# Patient Record
Sex: Male | Born: 1985 | Race: White | Hispanic: No | Marital: Married | Smoking: Current every day smoker
Health system: Southern US, Community
[De-identification: ages and names within clinical notes are randomized; demographics above are authoritative.]

## PROBLEM LIST (undated history)

## (undated) DIAGNOSIS — G43909 Migraine, unspecified, not intractable, without status migrainosus: Secondary | ICD-10-CM

## (undated) DIAGNOSIS — K219 Gastro-esophageal reflux disease without esophagitis: Secondary | ICD-10-CM

## (undated) DIAGNOSIS — Z720 Tobacco use: Secondary | ICD-10-CM

## (undated) DIAGNOSIS — T7840XA Allergy, unspecified, initial encounter: Secondary | ICD-10-CM

## (undated) HISTORY — DX: Gastro-esophageal reflux disease without esophagitis: K21.9

## (undated) HISTORY — DX: Tobacco use: Z72.0

## (undated) HISTORY — DX: Allergy, unspecified, initial encounter: T78.40XA

---
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
## 2007-06-23 ENCOUNTER — Ambulatory Visit (HOSPITAL_COMMUNITY): Admission: RE | Admit: 2007-06-23 | Discharge: 2007-06-23 | Payer: Self-pay | Admitting: Family Medicine

## 2007-09-20 ENCOUNTER — Emergency Department (HOSPITAL_COMMUNITY): Admission: EM | Admit: 2007-09-20 | Discharge: 2007-09-20 | Payer: Self-pay | Admitting: Emergency Medicine

## 2008-07-01 ENCOUNTER — Emergency Department (HOSPITAL_COMMUNITY): Admission: EM | Admit: 2008-07-01 | Discharge: 2008-07-01 | Payer: Self-pay | Admitting: Emergency Medicine

## 2008-10-04 ENCOUNTER — Emergency Department (HOSPITAL_COMMUNITY): Admission: EM | Admit: 2008-10-04 | Discharge: 2008-10-05 | Payer: Self-pay | Admitting: Emergency Medicine

## 2008-12-15 ENCOUNTER — Emergency Department (HOSPITAL_COMMUNITY): Admission: EM | Admit: 2008-12-15 | Discharge: 2008-12-15 | Payer: Self-pay | Admitting: Emergency Medicine

## 2009-11-25 ENCOUNTER — Emergency Department (HOSPITAL_COMMUNITY): Admission: EM | Admit: 2009-11-25 | Discharge: 2009-11-25 | Payer: Self-pay | Admitting: Emergency Medicine

## 2010-05-09 LAB — COMPREHENSIVE METABOLIC PANEL WITH GFR
ALT: 18 U/L (ref 0–53)
AST: 19 U/L (ref 0–37)
Albumin: 4.4 g/dL (ref 3.5–5.2)
Alkaline Phosphatase: 70 U/L (ref 39–117)
BUN: 8 mg/dL (ref 6–23)
CO2: 27 meq/L (ref 19–32)
Calcium: 9.9 mg/dL (ref 8.4–10.5)
Chloride: 103 meq/L (ref 96–112)
Creatinine, Ser: 0.98 mg/dL (ref 0.4–1.5)
GFR calc non Af Amer: >60 mL/min
Glucose, Bld: 88 mg/dL (ref 70–99)
Potassium: 3.7 meq/L (ref 3.5–5.1)
Sodium: 139 meq/L (ref 135–145)
Total Bilirubin: 0.8 mg/dL (ref 0.3–1.2)
Total Protein: 7.5 g/dL (ref 6.0–8.3)

## 2010-05-09 LAB — DIFFERENTIAL
Basophils Absolute: 0 10*3/uL (ref 0.0–0.1)
Eosinophils Absolute: 0.1 10*3/uL (ref 0.0–0.7)
Eosinophils Relative: 1 % (ref 0–5)
Lymphocytes Relative: 17 % (ref 12–46)
Lymphs Abs: 2.2 10*3/uL (ref 0.7–4.0)
Monocytes Absolute: 0.7 10*3/uL (ref 0.1–1.0)
Monocytes Relative: 6 % (ref 3–12)
Neutro Abs: 9.9 10*3/uL — ABNORMAL HIGH (ref 1.7–7.7)

## 2010-05-09 LAB — CBC
HCT: 48.8 % (ref 39.0–52.0)
Hemoglobin: 16.9 g/dL (ref 13.0–17.0)
MCH: 31.1 pg (ref 26.0–34.0)
MCHC: 34.7 g/dL (ref 30.0–36.0)
MCV: 89.7 fL (ref 78.0–100.0)
Platelets: 235 10*3/uL (ref 150–400)
RBC: 5.44 MIL/uL (ref 4.22–5.81)
RDW: 13.1 % (ref 11.5–15.5)
WBC: 13 10*3/uL — ABNORMAL HIGH (ref 4.0–10.5)

## 2010-05-09 LAB — SAMPLE TO BLOOD BANK

## 2010-05-09 LAB — URINALYSIS, ROUTINE W REFLEX MICROSCOPIC
Ketones, ur: NEGATIVE mg/dL
Nitrite: NEGATIVE
Protein, ur: NEGATIVE mg/dL
Specific Gravity, Urine: 1.02 (ref 1.005–1.030)
Urobilinogen, UA: 0.2 mg/dL (ref 0.0–1.0)
pH: 6.5 (ref 5.0–8.0)

## 2010-06-05 LAB — DIFFERENTIAL
Eosinophils Absolute: 0.2 10*3/uL (ref 0.0–0.7)
Eosinophils Relative: 2 % (ref 0–5)
Lymphs Abs: 2.8 10*3/uL (ref 0.7–4.0)
Monocytes Absolute: 0.7 10*3/uL (ref 0.1–1.0)
Monocytes Relative: 8 % (ref 3–12)

## 2010-06-05 LAB — BASIC METABOLIC PANEL
BUN: 9 mg/dL (ref 6–23)
Calcium: 9 mg/dL (ref 8.4–10.5)
Chloride: 105 mEq/L (ref 96–112)
GFR calc non Af Amer: >60 mL/min (ref >60)

## 2010-06-05 LAB — CBC
Hemoglobin: 16 g/dL (ref 13.0–17.0)
Platelets: 232 10*3/uL (ref 150–400)
RDW: 12.2 % (ref 11.5–15.5)

## 2010-06-05 LAB — URINALYSIS, ROUTINE W REFLEX MICROSCOPIC
Bilirubin Urine: NEGATIVE
Ketones, ur: NEGATIVE mg/dL
Protein, ur: NEGATIVE mg/dL
pH: 6 (ref 5.0–8.0)

## 2010-06-05 LAB — GLUCOSE, CAPILLARY: Glucose-Capillary: 93 mg/dL (ref 70–99)

## 2010-06-05 LAB — MAGNESIUM: Magnesium: 2.4 mg/dL (ref 1.5–2.5)

## 2010-06-05 LAB — POCT CARDIAC MARKERS

## 2010-09-11 ENCOUNTER — Encounter: Payer: Self-pay | Admitting: Emergency Medicine

## 2010-09-11 ENCOUNTER — Emergency Department (HOSPITAL_COMMUNITY)
Admission: EM | Admit: 2010-09-11 | Discharge: 2010-09-12 | Disposition: A | Discharge status: Home / Self Care | Payer: Medicaid Other | Attending: Emergency Medicine | Admitting: Emergency Medicine

## 2010-09-11 ENCOUNTER — Emergency Department (HOSPITAL_COMMUNITY): Payer: Medicaid Other

## 2010-09-11 DIAGNOSIS — R1031 Right lower quadrant pain: Secondary | ICD-10-CM | POA: Insufficient documentation

## 2010-09-11 DIAGNOSIS — R197 Diarrhea, unspecified: Secondary | ICD-10-CM | POA: Insufficient documentation

## 2010-09-11 DIAGNOSIS — R110 Nausea: Secondary | ICD-10-CM | POA: Insufficient documentation

## 2010-09-11 DIAGNOSIS — F17200 Nicotine dependence, unspecified, uncomplicated: Secondary | ICD-10-CM | POA: Insufficient documentation

## 2010-09-11 LAB — URINALYSIS, ROUTINE W REFLEX MICROSCOPIC
Glucose, UA: NEGATIVE mg/dL
Ketones, ur: NEGATIVE mg/dL
Leukocytes, UA: NEGATIVE
Protein, ur: 30 mg/dL — AB

## 2010-09-11 LAB — CBC
HCT: 45.3 % (ref 39.0–52.0)
Hemoglobin: 15.7 g/dL (ref 13.0–17.0)
Platelets: 211 10*3/uL (ref 150–400)
RBC: 5.18 MIL/uL (ref 4.22–5.81)
WBC: 9.9 10*3/uL (ref 4.0–10.5)

## 2010-09-11 LAB — DIFFERENTIAL
Basophils Absolute: 0 10*3/uL (ref 0.0–0.1)
Basophils Relative: 0 % (ref 0–1)
Eosinophils Relative: 2 % (ref 0–5)
Lymphocytes Relative: 31 % (ref 12–46)
Lymphs Abs: 3.1 10*3/uL (ref 0.7–4.0)
Monocytes Absolute: 0.8 10*3/uL (ref 0.1–1.0)
Neutro Abs: 5.9 10*3/uL (ref 1.7–7.7)
Neutrophils Relative %: 59 % (ref 43–77)

## 2010-09-11 LAB — COMPREHENSIVE METABOLIC PANEL
ALT: 40 U/L (ref 0–53)
Calcium: 9.2 mg/dL (ref 8.4–10.5)
GFR calc Af Amer: >60 mL/min (ref >60)
Potassium: 4 mEq/L (ref 3.5–5.1)

## 2010-09-11 MED ORDER — METOCLOPRAMIDE HCL 5 MG/ML IJ SOLN
10.0000 mg | Freq: Once | INTRAMUSCULAR | Status: AC
  Administered 2010-09-11: 10 mg via INTRAVENOUS
  Filled 2010-09-11: qty 2

## 2010-09-11 MED ORDER — IOHEXOL 300 MG/ML  SOLN
100.0000 mL | Freq: Once | INTRAMUSCULAR | Status: AC | PRN
  Administered 2010-09-11: 100 mL via INTRAVENOUS

## 2010-09-11 MED ORDER — OXYCODONE-ACETAMINOPHEN 5-325 MG PO TABS: ORAL_TABLET | ORAL | Status: DC

## 2010-09-11 MED ORDER — SODIUM CHLORIDE 0.9 % IV SOLN
999.0000 mL | INTRAVENOUS | Status: DC
  Administered 2010-09-11 (×2): 1000 mL via INTRAVENOUS

## 2010-09-11 MED ORDER — HYDROMORPHONE HCL 1 MG/ML IJ SOLN
1.0000 mg | Freq: Once | INTRAMUSCULAR | Status: AC
  Administered 2010-09-11: 1 mg via INTRAVENOUS
  Filled 2010-09-11: qty 1

## 2010-09-11 MED ORDER — HYDROMORPHONE HCL 1 MG/ML IJ SOLN
1.0000 mg | Freq: Once | INTRAMUSCULAR | Status: AC
  Administered 2010-09-12: 1 mg via INTRAVENOUS
  Filled 2010-09-11: qty 1

## 2010-09-11 MED ORDER — ONDANSETRON HCL 4 MG/2ML IJ SOLN
4.0000 mg | Freq: Once | INTRAMUSCULAR | Status: AC
  Administered 2010-09-11: 4 mg via INTRAVENOUS
  Filled 2010-09-11: qty 2

## 2010-09-11 MED ORDER — ONDANSETRON HCL 4 MG PO TABS: 4.0000 mg | ORAL_TABLET | Freq: Four times a day (QID) | ORAL | Status: AC

## 2010-09-11 NOTE — ED Provider Notes (Signed)
History     Chief Complaint  Patient presents with  . Abdominal Pain  . Diarrhea  . Nausea   Patient is a 25 y.o. male presenting with abdominal pain. The history is provided by the patient.  Abdominal Pain The primary symptoms of the illness include abdominal pain, nausea and diarrhea. The primary symptoms of the illness do not include fever, shortness of breath, vomiting, hematemesis, hematochezia or dysuria. The current episode started 3 to 5 hours ago. The onset of the illness was sudden. The problem has not changed since onset. The pain came on gradually. The abdominal pain has been unchanged since its onset. The abdominal pain is located in the RLQ. The abdominal pain does not radiate.  The diarrhea began today. The diarrhea is watery. The diarrhea occurs 2 to 4 times per day.  Symptoms associated with the illness do not include chills, heartburn, constipation, urgency, hematuria, frequency or back pain.    History reviewed. No pertinent past medical history.  History reviewed. No pertinent past surgical history.  History reviewed. No pertinent family history.  History  Substance Use Topics  . Smoking status: Current Everyday Smoker -- 1.0 packs/day    Types: Cigarettes  . Smokeless tobacco: Not on file  . Alcohol Use: Yes      Review of Systems  Constitutional: Positive for appetite change. Negative for fever, chills and unexpected weight change.  HENT: Negative for sore throat, neck pain and neck stiffness.   Respiratory: Negative for cough and shortness of breath.   Cardiovascular: Negative for chest pain.  Gastrointestinal: Positive for nausea, abdominal pain and diarrhea. Negative for heartburn, vomiting, constipation, blood in stool, hematochezia, abdominal distention and hematemesis.  Genitourinary: Negative for dysuria, urgency, frequency and hematuria.  Musculoskeletal: Negative for back pain.  Skin: Negative.   Neurological: Negative for dizziness, weakness  and numbness.  Hematological: Does not bruise/bleed easily.  Psychiatric/Behavioral: Negative for behavioral problems.    Physical Exam  BP 128/71  Pulse 80  Temp(Src) 98.6 F (37 C) (Oral)  Resp 14  Ht 6' (1.829 m)  Wt 288 lb (130.636 kg)  BMI 39.06 kg/m2  SpO2 96%  Physical Exam  Nursing note and vitals reviewed. Constitutional: He is oriented to person, place, and time. He appears well-developed and well-nourished. No distress.  HENT:  Head: Normocephalic and atraumatic.  Eyes: Pupils are equal, round, and reactive to light.  Neck: Normal range of motion. Neck supple.  Cardiovascular: Normal rate, regular rhythm and normal heart sounds.   Pulmonary/Chest: Effort normal and breath sounds normal.  Abdominal: Soft. Bowel sounds are normal. He exhibits no distension and no mass. There is no splenomegaly or hepatomegaly. There is tenderness in the right lower quadrant. There is no rigidity, no rebound, no guarding and no CVA tenderness.    Musculoskeletal: He exhibits no edema and no tenderness.  Lymphadenopathy:    He has no cervical adenopathy.  Neurological: He is alert and oriented to person, place, and time. No cranial nerve deficit.  Skin: Skin is warm and dry.  Psychiatric: He has a normal mood and affect.    ED Course  Procedures  MDM  2337  Patient resting, feeling better, nausea resolved.  No diarrhea or vomiting in ED.  abd remains soft, mild RLQ tenderness w/o guarding or rebound.  Had neg CT of abd/pelvis tonight.  I have advised pt of need for close f/u with his PMD this week.  Pt was also seen by EDP and care plan was  discussed.   Results for orders placed during the hospital encounter of 09/11/10  CBC      Component Value Range   WBC 9.9  4.0 - 10.5 (K/uL)   RBC 5.18  4.22 - 5.81 (MIL/uL)   Hemoglobin 15.7  13.0 - 17.0 (g/dL)   HCT 57.8  46.9 - 62.9 (%)   MCV 87.5  78.0 - 100.0 (fL)   MCH 30.3  26.0 - 34.0 (pg)   MCHC 34.7  30.0 - 36.0 (g/dL)   RDW  52.8  41.3 - 24.4 (%)   Platelets 211  150 - 400 (K/uL)  DIFFERENTIAL      Component Value Range   Neutrophils Relative 59  43 - 77 (%)   Neutro Abs 5.9  1.7 - 7.7 (K/uL)   Lymphocytes Relative 31  12 - 46 (%)   Lymphs Abs 3.1  0.7 - 4.0 (K/uL)   Monocytes Relative 8  3 - 12 (%)   Monocytes Absolute 0.8  0.1 - 1.0 (K/uL)   Eosinophils Relative 2  0 - 5 (%)   Eosinophils Absolute 0.2  0.0 - 0.7 (K/uL)   Basophils Relative 0  0 - 1 (%)   Basophils Absolute 0.0  0.0 - 0.1 (K/uL)  COMPREHENSIVE METABOLIC PANEL      Component Value Range   Sodium 140  135 - 145 (mEq/L)   Potassium 4.0  3.5 - 5.1 (mEq/L)   Chloride 105  96 - 112 (mEq/L)   CO2 28  19 - 32 (mEq/L)   Glucose, Bld 89  70 - 99 (mg/dL)   BUN 10  6 - 23 (mg/dL)   Creatinine, Ser 0.10  0.50 - 1.35 (mg/dL)   Calcium 9.2  8.4 - 27.2 (mg/dL)   Total Protein 7.7  6.0 - 8.3 (g/dL)   Albumin 3.9  3.5 - 5.2 (g/dL)   AST 28  0 - 37 (U/L)   ALT 40  0 - 53 (U/L)   Alkaline Phosphatase 78  39 - 117 (U/L)   Total Bilirubin 0.3  0.3 - 1.2 (mg/dL)   GFR calc non Af Amer >60  >60 (mL/min)   GFR calc Af Amer >60  >60 (mL/min)  LIPASE, BLOOD      Component Value Range   Lipase 36  11 - 59 (U/L)  URINALYSIS, ROUTINE W REFLEX MICROSCOPIC      Component Value Range   Color, Urine YELLOW  YELLOW    Appearance HAZY (*) CLEAR    Specific Gravity, Urine >1.030 (*) 1.005 - 1.030    pH 6.0  5.0 - 8.0    Glucose, UA NEGATIVE  NEGATIVE (mg/dL)   Hgb urine dipstick NEGATIVE  NEGATIVE    Bilirubin Urine SMALL (*) NEGATIVE    Ketones, ur NEGATIVE  NEGATIVE (mg/dL)   Protein, ur 30 (*) NEGATIVE (mg/dL)   Urobilinogen, UA 1.0  0.0 - 1.0 (mg/dL)   Nitrite NEGATIVE  NEGATIVE    Leukocytes, UA NEGATIVE  NEGATIVE   URINE MICROSCOPIC-ADD ON      Component Value Range   Squamous Epithelial / LPF RARE  RARE    WBC, UA 0-2  <3 (WBC/hpf)   Bacteria, UA MANY (*) RARE     Ct Abdomen Pelvis W Contrast  09/11/2010  *RADIOLOGY REPORT*  Clinical Data:  25 year old male right abdominal pain with diarrhea and nausea.  CT ABDOMEN AND PELVIS WITH CONTRAST  Technique:  Multidetector CT imaging of the abdomen and pelvis was performed following the  standard protocol during bolus administration of intravenous contrast.  Contrast: 100 ml Omnipaque-300.  Comparison: 09/15/2006 and earlier.  Findings: Minor lung base atelectasis. No acute osseous abnormality identified.  No pelvic free fluid.  Decompressed bladder.  Negative distal colon.  Much of the colon is decompressed.  Normal appendix (series 2 image 76, tracking to the midline image 69).  Normal terminal ileum.  No dilated or abnormal small bowel identified.  Suboptimal intravascular contrast bolus.  Negative stomach, duodenum, liver, gallbladder, spleen, pancreas, adrenal glands, kidneys, portal venous system, and major arterial structures.  No abdominal free fluid.  No lymphadenopathy.  IMPRESSION: Normal appendix.  Normal CT of the abdomen pelvis.  Minor atelectasis.  Original Report Authenticated By: Harley Hallmark, M.D.        Martise Waddell L. Bonaparte, [place] 09/22/10 1657

## 2010-09-11 NOTE — ED Notes (Signed)
[place], pac in with pt at this time, nad

## 2010-09-11 NOTE — ED Notes (Signed)
Patient states was at work this morning and began having sharp pain in RLQ of abdomen.  Patient states has had several episodes of diarrhea; states has nausea.

## 2010-09-11 NOTE — ED Notes (Signed)
edpa aware that pt requesting pain and nausea meds.

## 2010-09-12 ENCOUNTER — Encounter (HOSPITAL_COMMUNITY): Payer: Self-pay

## 2010-09-25 ENCOUNTER — Emergency Department (HOSPITAL_COMMUNITY)
Admission: EM | Admit: 2010-09-25 | Discharge: 2010-09-26 | Disposition: A | Discharge status: Home / Self Care | Payer: Medicaid Other | Attending: Emergency Medicine | Admitting: Emergency Medicine

## 2010-09-25 ENCOUNTER — Emergency Department (HOSPITAL_COMMUNITY): Payer: Medicaid Other

## 2010-09-25 ENCOUNTER — Encounter (HOSPITAL_COMMUNITY): Payer: Self-pay | Admitting: Emergency Medicine

## 2010-09-25 DIAGNOSIS — F17200 Nicotine dependence, unspecified, uncomplicated: Secondary | ICD-10-CM | POA: Insufficient documentation

## 2010-09-25 DIAGNOSIS — R109 Unspecified abdominal pain: Secondary | ICD-10-CM

## 2010-09-25 LAB — URINALYSIS, ROUTINE W REFLEX MICROSCOPIC
Glucose, UA: NEGATIVE mg/dL
Leukocytes, UA: NEGATIVE
pH: 6 (ref 5.0–8.0)

## 2010-09-25 LAB — DIFFERENTIAL
Lymphocytes Relative: 28 % (ref 12–46)
Monocytes Absolute: 0.8 10*3/uL (ref 0.1–1.0)
Monocytes Relative: 8 % (ref 3–12)
Neutro Abs: 6.6 10*3/uL (ref 1.7–7.7)

## 2010-09-25 LAB — CBC
HCT: 47.2 % (ref 39.0–52.0)
Hemoglobin: 16.5 g/dL (ref 13.0–17.0)
RBC: 5.36 MIL/uL (ref 4.22–5.81)
WBC: 10.5 10*3/uL (ref 4.0–10.5)

## 2010-09-25 LAB — BASIC METABOLIC PANEL
BUN: 8 mg/dL (ref 6–23)
CO2: 25 mEq/L (ref 19–32)
Chloride: 103 mEq/L (ref 96–112)
Creatinine, Ser: 0.84 mg/dL (ref 0.50–1.35)

## 2010-09-25 MED ORDER — ONDANSETRON HCL 4 MG/2ML IJ SOLN
4.0000 mg | Freq: Once | INTRAMUSCULAR | Status: AC
  Administered 2010-09-25: 4 mg via INTRAVENOUS

## 2010-09-25 MED ORDER — SODIUM CHLORIDE 0.9 % IV SOLN
Freq: Once | INTRAVENOUS | Status: AC
  Administered 2010-09-25: 22:00:00 via INTRAVENOUS

## 2010-09-25 MED ORDER — HYDROMORPHONE HCL 2 MG/ML IJ SOLN
2.0000 mg | Freq: Once | INTRAMUSCULAR | Status: AC
  Administered 2010-09-25: 2 mg via INTRAVENOUS

## 2010-09-25 NOTE — ED Notes (Signed)
Pt requesting pain medication at this time. EDP notified. 

## 2010-09-25 NOTE — ED Provider Notes (Addendum)
History   Chart scribed for Carleene Cooper III, MD by Osvaldo Human; the patient was seen in room APA01/APA01; this patient's care was started at 10:34 PM.    Chief Complaint  Patient presents with  . Flank Pain  . Emesis   HPI Shane Crawford is a 25 y.o. male who presents to the Emergency Department complaining of abd pain. Pt reports right sided abd pain that radiates to right flank onset several weeks ago. Pt was seen in ED at that time, with no acute findings on abd CT or labs. Pt states pain has persisted and is unchanged from onset. Reports n/v onset last night and persisted through today with associated hoarseness and sore throat. No diarrhea. Pt denies ear ache, fever, chills, change in vision, cough, sob, cp, urinary complaints, syncope, rash, or seizures.   History reviewed. No pertinent past medical history.  History reviewed. No pertinent past surgical history.  History reviewed. No pertinent family history.  History  Substance Use Topics  . Smoking status: Current Everyday Smoker -- 1.0 packs/day    Types: Cigarettes  . Smokeless tobacco: Not on file  . Alcohol Use: No      Review of Systems 10 Systems reviewed and are negative for acute change except as noted in the HPI.  Physical Exam  BP 122/74  Pulse 78  Temp(Src) 98.1 F (36.7 C) (Oral)  Resp 16  Ht 6' (1.829 m)  Wt 288 lb (130.636 kg)  BMI 39.06 kg/m2  SpO2 98%  Physical Exam  Nursing note and vitals reviewed. Constitutional: He is oriented to person, place, and time. He appears well-developed and well-nourished. No distress.  HENT:  Head: Normocephalic.  Mouth/Throat: Mucous membranes are normal.  Eyes: No scleral icterus.       Normal appearance  Neck: Normal range of motion. Neck supple.  Cardiovascular: Normal rate and regular rhythm.  Exam reveals no gallop and no friction rub.   No murmur heard. Pulmonary/Chest: Effort normal and breath sounds normal. He has no wheezes. He has no  rhonchi. He has no rales.  Abdominal: Soft. He exhibits no mass. There is no tenderness. There is no guarding.  Musculoskeletal: Normal range of motion.       Normal appearance  Neurological: He is alert and oriented to person, place, and time.       Motor intact in all extremities  Skin: Skin is warm and dry. No rash noted.       Color normal; no shingle like rash  Psychiatric: He has a normal mood and affect.    ED Course  Procedures  OTHER DATA REVIEWED: Nursing notes, vital signs, and past medical records reviewed. No acute findings Abd CT on 09-11-10.     LABS / RADIOLOGY:  Results for orders placed during the hospital encounter of 09/25/10  URINALYSIS, ROUTINE W REFLEX MICROSCOPIC      Component Value Range   Color, Urine YELLOW  YELLOW    Appearance CLEAR  CLEAR    Specific Gravity, Urine 1.025  1.005 - 1.030    pH 6.0  5.0 - 8.0    Glucose, UA NEGATIVE  NEGATIVE (mg/dL)   Hgb urine dipstick NEGATIVE  NEGATIVE    Bilirubin Urine NEGATIVE  NEGATIVE    Ketones, ur NEGATIVE  NEGATIVE (mg/dL)   Protein, ur NEGATIVE  NEGATIVE (mg/dL)   Urobilinogen, UA 0.2  0.0 - 1.0 (mg/dL)   Nitrite NEGATIVE  NEGATIVE    Leukocytes, UA NEGATIVE  NEGATIVE  CBC      Component Value Range   WBC 10.5  4.0 - 10.5 (K/uL)   RBC 5.36  4.22 - 5.81 (MIL/uL)   Hemoglobin 16.5  13.0 - 17.0 (g/dL)   HCT 16.1  09.6 - 04.5 (%)   MCV 88.1  78.0 - 100.0 (fL)   MCH 30.8  26.0 - 34.0 (pg)   MCHC 35.0  30.0 - 36.0 (g/dL)   RDW 40.9  81.1 - 91.4 (%)   Platelets 224  150 - 400 (K/uL)  DIFFERENTIAL      Component Value Range   Neutrophils Relative 62  43 - 77 (%)   Neutro Abs 6.6  1.7 - 7.7 (K/uL)   Lymphocytes Relative 28  12 - 46 (%)   Lymphs Abs 2.9  0.7 - 4.0 (K/uL)   Monocytes Relative 8  3 - 12 (%)   Monocytes Absolute 0.8  0.1 - 1.0 (K/uL)   Eosinophils Relative 2  0 - 5 (%)   Eosinophils Absolute 0.2  0.0 - 0.7 (K/uL)   Basophils Relative 0  0 - 1 (%)   Basophils Absolute 0.0  0.0 - 0.1  (K/uL)  BASIC METABOLIC PANEL      Component Value Range   Sodium 141  135 - 145 (mEq/L)   Potassium 4.2  3.5 - 5.1 (mEq/L)   Chloride 103  96 - 112 (mEq/L)   CO2 25  19 - 32 (mEq/L)   Glucose, Bld 87  70 - 99 (mg/dL)   BUN 8  6 - 23 (mg/dL)   Creatinine, Ser 7.82  0.50 - 1.35 (mg/dL)   Calcium 9.5  8.4 - 95.6 (mg/dL)   GFR calc non Af Amer >60  >60 (mL/min)   GFR calc Af Amer >60  >60 (mL/min)    Dg Abd Acute W/chest  09/25/2010  *RADIOLOGY REPORT*  Clinical Data: Right-sided abdominal flank pain.  Nausea vomiting.  ACUTE ABDOMEN SERIES (ABDOMEN 2 VIEW & CHEST 1 VIEW)  Comparison: None.  Findings: No evidence of dilated bowel loops, however scattered air fluid levels are seen within the small bowel loops in the left abdomen.  No evidence of free air.  No evidence of radiopaque calculi.  Heart size and mediastinal contours are normal.  Both lungs are clear.  IMPRESSION:  1.  Nonspecific, nonobstructive bowel gas pattern. 2.  No active cardiopulmonary disease.  Original Report Authenticated By: Danae Orleans, M.D.    ED COURSE / COORDINATION OF CARE: Pt recheck 12:05 AM: Pt still c/o some pain. Discussed workup findings and plan for discharge, pt understands and agrees with plan. PCP Dr. Gerda Diss.   MDM: Pt recently thoroughly evaluated for same pain; no new acute findings today; f/u recommended with PCP or pain management clinic   IMPRESSION:       Flank pain, acute    PLAN:  Discharge home The patient is to return the emergency department if there is any worsening of symptoms. I have reviewed the discharge instructions with the patient.   CONDITION ON DISCHARGE: good   MEDICATIONS GIVEN IN THE E.D. 0.9 %  sodium chloride infusion (  Intravenous New Bag 09/25/10 2225)  HYDROmorphone (DILAUDID) injection 2 mg (2 mg Intravenous Given 09/25/10 2229)  ondansetron (ZOFRAN) injection 4 mg (4 mg Intravenous Given 09/25/10 2227)     DISCHARGE MEDICATIONS: New Prescriptions    No medications on file     I personally performed the services described in this documentation, which was scribed in  my presence. The recorded information has been reviewed and considered.  Osvaldo Human, M.D.      Carleene Cooper III, MD 09/26/10 [id number]  Carleene Cooper III, MD 11/02/10 [phone]

## 2010-09-25 NOTE — ED Notes (Signed)
Pt c/o rt flank pain that radiates to his back pt was seen here for the same 2 weeks ago. Pt states he has been vomiting since last night.

## 2010-09-26 HISTORY — PX: APPENDECTOMY: SHX54

## 2010-09-26 MED ORDER — HYDROMORPHONE HCL 2 MG/ML IJ SOLN
2.0000 mg | Freq: Once | INTRAMUSCULAR | Status: AC
  Administered 2010-09-26: 2 mg via INTRAVENOUS

## 2010-10-05 ENCOUNTER — Encounter (HOSPITAL_COMMUNITY): Payer: Self-pay

## 2010-10-05 ENCOUNTER — Encounter (HOSPITAL_COMMUNITY)
Admission: RE | Admit: 2010-10-05 | Discharge: 2010-10-05 | Disposition: A | Discharge status: Home / Self Care | Payer: Medicaid Other | Source: Ambulatory Visit | Attending: General Surgery | Admitting: General Surgery

## 2010-10-05 LAB — SURGICAL PCR SCREEN: Staphylococcus aureus: NEGATIVE

## 2010-10-05 MED ORDER — LACTATED RINGERS IV SOLN: INTRAVENOUS | Status: DC

## 2010-10-05 NOTE — Patient Instructions (Addendum)
[name]  10/05/2010   Your procedure is scheduled on:  10/08/2010  Report to [facility] at 1100 AM.  Call this number if you have problems the morning of surgery: [phone]   Remember:   Do not eat food:After Midnight.  Do not drink clear liquids: After Midnight.  Take these medicines the morning of surgery with A SIP OF WATER: none   Do not wear jewelry, make-up or nail polish.  Do not wear lotions, powders, or perfumes. You may wear deodorant.  Do not shave 48 hours prior to surgery.  Do not bring valuables to the hospital.  Contacts, dentures or bridgework may not be worn into surgery.  Leave suitcase in the car. After surgery it may be brought to your room.  For patients admitted to the hospital, checkout time is 11:00 AM the day of discharge.   Patients discharged the day of surgery will not be allowed to drive home.  Name and phone number of your driver: friend  Special Instructions: CHG Shower Use Special Wash: 1/2 bottle night before surgery and 1/2 bottle morning of surgery.   Please read over the following fact sheets that you were given: Pain Booklet, MRSA Information, Surgical Site Infection Prevention, Anesthesia Post-op Instructions and Care and Recovery After Surgery PATIENT INSTRUCTIONS POST-ANESTHESIA  IMMEDIATELY FOLLOWING SURGERY:  Do not drive or operate machinery for the first twenty four hours after surgery.  Do not make any important decisions for twenty four hours after surgery or while taking narcotic pain medications or sedatives.  If you develop intractable nausea and vomiting or a severe headache please notify your doctor immediately.  FOLLOW-UP:  Please make an appointment with your surgeon as instructed. You do not need to follow up with anesthesia unless specifically instructed to do so.  WOUND CARE INSTRUCTIONS (if applicable):  Keep a dry clean dressing on the anesthesia/puncture wound site if there is drainage.  Once the wound has quit  draining you may leave it open to air.  Generally you should leave the bandage intact for twenty four hours unless there is drainage.  If the epidural site drains for more than 36-48 hours please call the anesthesia department.  QUESTIONS?:  Please feel free to call your physician or the hospital operator if you have any questions, and they will be happy to assist you.     [facility] Anesthesia Department [address] [place] [phone]

## 2010-10-07 NOTE — H&P (Signed)
NTS SOAP Note  Vital Signs:  Vitals as of: 10/04/2010: Systolic 156: Diastolic 85: Heart Rate 93: Temp 98.66F: Height 3ft 0in: Weight 288Lbs 0 Ounces: BMI 39  BMI : 39.06 kg/m2  Subjective: This 65 Years 27 Months old Male presents for of Chronic abdominal pain. Patient presented to my office with a several year history of right lower quadrant and right flank pain. He states this is intermittent but when it occurs he has associated nausea and occasional emesis. He states he is not able to work due to the pain and has been sent home on multiple occasions. He has been worked up in the emergency department at [facility]. he states low-grade fever. He has not had any episodes of bowel change. No melena no hematochezia. No unusual travel. No sick contacts. No family history of gastrointestinal diseases. No history of Crohn's. No history of ulcerative colitis. She denies any diarrhea. Pain is worse with movement and palpation. No relieving factors. He denies any change with pain with by mouth intake or bowel movements.  he has never had a colonoscopy or endoscopy. No weight loss. No night sweats.  No Dysuria. No hematuria. No history of renal calculi. Patient is not note any timing relationship with the onset of the pain.  Review of Symptoms:  Constitutional:unremarkable Head:unremarkable Eyes:unremarkable Nose/Mouth/Throat:unremarkable Cardiovascular:unremarkable Respiratory:unremarkable As per history of present illness Genitourinary:unremarkable Musculoskeletal:unremarkable Skin:unremarkable Breast:unremarkable Hematolgic/Lymphatic:unremarkable Allergic/Immunologic:unremarkable    Past Medical History:Obtained   Past Medical History  Surgical History: none Medical Problems: none Psychiatric History: none Allergies: NKDA Medications: Hydrocodeine   Social History:Obtained   Social History  Age: 24 Years 8 Months Marital Status:   S   Smoking Status: Current every day smoker reviewed on 10/04/2010 Started Date: 02/25/1998 Packs per day: 1.00   Family History:Obtained   Family History  Is there a family history of:non-contributory   Medication Allergies:   Allergies Insert Code:   Objective Information: General:Well appearing, well nourished in no distress.Obese. Slightly disheveled. Skin:no rash or prominent lesions Head:Atraumatic; no masses; no abnormalities Eyes:conjunctiva clear, EOM intact, PERRL Mouth:Mucous membranes moist, no mucosal lesions. Neck:Supple without lymphadenopathy.  Heart:RRR, no murmur or gallop.  Normal S1, S2.  No S3, S4.  Lungs:CTA bilaterally, no wheezes, rhonchi, rales.  Breathing unlabored. Abdomen:Soft, ND, normal bowel sounds, no HSM, no masses. No hernias.Patient does have moderate right lower quadrant pain although it is distractible.  No peritoneal signs. OZ:HYQMVHQIONGE Extremities:No deformities, clubbing, cyanosis, or edema.   Results Basic metabolic panel (Order [id number])         Entry Date     09/25/2010      Component Results       Component  Value  Range & Units  Status    Sodium  141  135 - 145 mEq/L  Final    Potassium  4.2  3.5 - 5.1 mEq/L  Final    Chloride  103  96 - 112 mEq/L  Final    CO2  25  19 - 32 mEq/L  Final    Glucose, Bld  87  70 - 99 mg/dL  Final    BUN  8  6 - 23 mg/dL  Final    Creatinine, Ser  0.84  0.50 - 1.35 mg/dL  Final    Calcium  9.5  8.4 - 10.5 mg/dL  Final    GFR calc non Af Amer  >60  >60 mL/min  Final    GFR calc Af Amer  >60  >60 mL/min  Final        Assessment:Chronic Abdominal Pain.    Diagnosis &amp; Procedure: DiagnosisCode: 789.03, ProcedureCode: 16109,   Orders:    Plan: At this point I had a long conversation with the patient and his family regarding the findings. I had an extremely low suspicion of appendiceal etiology based on prior emergency room  workup. I do question whether some of his symptomatology is urologic in nature although he emphasizes he has never been diagnosed with renal calculi. I did discuss diagnostic laparoscopy as well as appendectomy at the time of the procedure. Risks benefits and alternatives were discussed. I also discussed the possibility of continued chronic symptomatology following the procedure. Options for second opinion were also discussed with patient. At this point he does wish to proceed and understands that symptomatology may not be resolved with an appendectomy.   Patient Education:Alternative treatments to surgery were discussed with patient (and family).Risks and benefits  of procedure were fully explained to the patient (and family) who gave informed consent. Patient/family questions were addressed.  Follow-up:Pending Surgery

## 2010-10-08 ENCOUNTER — Encounter (HOSPITAL_COMMUNITY): Payer: Self-pay | Admitting: Anesthesiology

## 2010-10-08 ENCOUNTER — Ambulatory Visit (HOSPITAL_COMMUNITY)
Admission: RE | Admit: 2010-10-08 | Discharge: 2010-10-08 | Disposition: A | Discharge status: Home / Self Care | Payer: Medicaid Other | Source: Ambulatory Visit | Attending: General Surgery | Admitting: General Surgery

## 2010-10-08 ENCOUNTER — Ambulatory Visit (HOSPITAL_COMMUNITY): Payer: Medicaid Other | Admitting: Anesthesiology

## 2010-10-08 ENCOUNTER — Other Ambulatory Visit: Payer: Self-pay | Admitting: General Surgery

## 2010-10-08 ENCOUNTER — Encounter (HOSPITAL_COMMUNITY)
Admission: RE | Disposition: A | Discharge status: Home / Self Care | Payer: Self-pay | Source: Ambulatory Visit | Attending: General Surgery

## 2010-10-08 DIAGNOSIS — R1031 Right lower quadrant pain: Secondary | ICD-10-CM | POA: Insufficient documentation

## 2010-10-08 DIAGNOSIS — G8929 Other chronic pain: Secondary | ICD-10-CM

## 2010-10-08 HISTORY — PX: LAPAROSCOPY: SHX197

## 2010-10-08 HISTORY — PX: LAPAROSCOPIC APPENDECTOMY: SHX408

## 2010-10-08 SURGERY — LAPAROSCOPY, DIAGNOSTIC
Anesthesia: General | Site: Abdomen | Wound class: Clean

## 2010-10-08 MED ORDER — ROCURONIUM BROMIDE 100 MG/10ML IV SOLN
INTRAVENOUS | Status: DC | PRN
  Administered 2010-10-08: 10 mg via INTRAVENOUS
  Administered 2010-10-08: 25 mg via INTRAVENOUS
  Administered 2010-10-08: 5 mg via INTRAVENOUS

## 2010-10-08 MED ORDER — GLYCOPYRROLATE 0.2 MG/ML IJ SOLN
INTRAMUSCULAR | Status: AC
  Filled 2010-10-08: qty 2

## 2010-10-08 MED ORDER — FENTANYL CITRATE 0.05 MG/ML IJ SOLN
INTRAMUSCULAR | Status: DC | PRN
  Administered 2010-10-08 (×6): 50 ug via INTRAVENOUS

## 2010-10-08 MED ORDER — ONDANSETRON HCL 4 MG/2ML IJ SOLN
4.0000 mg | Freq: Once | INTRAMUSCULAR | Status: AC | PRN
  Administered 2010-10-08: 4 mg via INTRAVENOUS

## 2010-10-08 MED ORDER — ONDANSETRON HCL 4 MG/2ML IJ SOLN
INTRAMUSCULAR | Status: AC
  Administered 2010-10-08: 4 mg via INTRAVENOUS
  Filled 2010-10-08: qty 2

## 2010-10-08 MED ORDER — FENTANYL CITRATE 0.05 MG/ML IJ SOLN
INTRAMUSCULAR | Status: AC
  Filled 2010-10-08: qty 5

## 2010-10-08 MED ORDER — FENTANYL CITRATE 0.05 MG/ML IJ SOLN
INTRAMUSCULAR | Status: AC
  Administered 2010-10-08: 50 ug via INTRAVENOUS
  Filled 2010-10-08: qty 2

## 2010-10-08 MED ORDER — SODIUM CHLORIDE 0.9 % IR SOLN
Status: DC | PRN
  Administered 2010-10-08: 1000 mL

## 2010-10-08 MED ORDER — GLYCOPYRROLATE 0.2 MG/ML IJ SOLN
INTRAMUSCULAR | Status: DC | PRN
  Administered 2010-10-08: .8 mg via INTRAVENOUS

## 2010-10-08 MED ORDER — GLYCOPYRROLATE 0.2 MG/ML IJ SOLN
0.2000 mg | Freq: Once | INTRAMUSCULAR | Status: AC
  Administered 2010-10-08: 0.2 mg via INTRAVENOUS

## 2010-10-08 MED ORDER — PROPOFOL 10 MG/ML IV EMUL
INTRAVENOUS | Status: DC | PRN
  Administered 2010-10-08: 200 mg via INTRAVENOUS

## 2010-10-08 MED ORDER — OXYCODONE-ACETAMINOPHEN 5-325 MG PO TABS: ORAL_TABLET | ORAL | Status: DC

## 2010-10-08 MED ORDER — DEXTROSE 5 % IV SOLN
INTRAVENOUS | Status: AC
  Filled 2010-10-08: qty 1

## 2010-10-08 MED ORDER — NEOSTIGMINE METHYLSULFATE 1 MG/ML IJ SOLN
INTRAMUSCULAR | Status: DC | PRN
  Administered 2010-10-08: 4 mg via INTRAMUSCULAR

## 2010-10-08 MED ORDER — MIDAZOLAM HCL 2 MG/2ML IJ SOLN
1.0000 mg | INTRAMUSCULAR | Status: DC | PRN
  Administered 2010-10-08: 2 mg via INTRAVENOUS

## 2010-10-08 MED ORDER — PROPOFOL 10 MG/ML IV EMUL
INTRAVENOUS | Status: AC
  Filled 2010-10-08: qty 20

## 2010-10-08 MED ORDER — NEOSTIGMINE METHYLSULFATE 1 MG/ML IJ SOLN
INTRAMUSCULAR | Status: AC
  Filled 2010-10-08: qty 10

## 2010-10-08 MED ORDER — ENOXAPARIN SODIUM 40 MG/0.4ML ~~LOC~~ SOLN
40.0000 mg | Freq: Once | SUBCUTANEOUS | Status: AC
  Administered 2010-10-08: 40 mg via SUBCUTANEOUS

## 2010-10-08 MED ORDER — LACTATED RINGERS IV SOLN: INTRAVENOUS | Status: DC

## 2010-10-08 MED ORDER — ENOXAPARIN SODIUM 40 MG/0.4ML ~~LOC~~ SOLN
SUBCUTANEOUS | Status: AC
  Administered 2010-10-08: 40 mg via SUBCUTANEOUS
  Filled 2010-10-08: qty 0.4

## 2010-10-08 MED ORDER — MIDAZOLAM HCL 2 MG/2ML IJ SOLN
INTRAMUSCULAR | Status: AC
  Administered 2010-10-08: 2 mg via INTRAVENOUS
  Filled 2010-10-08: qty 2

## 2010-10-08 MED ORDER — ONDANSETRON HCL 4 MG/2ML IJ SOLN
4.0000 mg | Freq: Once | INTRAMUSCULAR | Status: AC
  Administered 2010-10-08: 4 mg via INTRAVENOUS

## 2010-10-08 MED ORDER — LACTATED RINGERS IV SOLN
INTRAVENOUS | Status: DC | PRN
  Administered 2010-10-08 (×2): via INTRAVENOUS

## 2010-10-08 MED ORDER — ROCURONIUM BROMIDE 50 MG/5ML IV SOLN
INTRAVENOUS | Status: AC
  Filled 2010-10-08: qty 1

## 2010-10-08 MED ORDER — LIDOCAINE HCL (PF) 1 % IJ SOLN
INTRAMUSCULAR | Status: AC
  Filled 2010-10-08: qty 5

## 2010-10-08 MED ORDER — BUPIVACAINE HCL (PF) 0.5 % IJ SOLN
INTRAMUSCULAR | Status: AC
  Filled 2010-10-08: qty 30

## 2010-10-08 MED ORDER — SUCCINYLCHOLINE CHLORIDE 20 MG/ML IJ SOLN
INTRAMUSCULAR | Status: DC | PRN
  Administered 2010-10-08: 160 mg via INTRAVENOUS

## 2010-10-08 MED ORDER — FENTANYL CITRATE 0.05 MG/ML IJ SOLN
25.0000 ug | INTRAMUSCULAR | Status: DC | PRN
  Administered 2010-10-08 (×4): 50 ug via INTRAVENOUS

## 2010-10-08 MED ORDER — LIDOCAINE HCL 1 % IJ SOLN
INTRAMUSCULAR | Status: DC | PRN
  Administered 2010-10-08: 50 mg via INTRADERMAL

## 2010-10-08 MED ORDER — MIDAZOLAM HCL 2 MG/2ML IJ SOLN
INTRAMUSCULAR | Status: AC
  Filled 2010-10-08: qty 2

## 2010-10-08 MED ORDER — DEXTROSE 5 % IV SOLN
1.0000 g | INTRAVENOUS | Status: DC
  Filled 2010-10-08: qty 1

## 2010-10-08 MED ORDER — SUCCINYLCHOLINE CHLORIDE 20 MG/ML IJ SOLN
INTRAMUSCULAR | Status: AC
  Filled 2010-10-08: qty 1

## 2010-10-08 MED ORDER — BUPIVACAINE HCL (PF) 0.5 % IJ SOLN
INTRAMUSCULAR | Status: DC | PRN
  Administered 2010-10-08: 10 mL

## 2010-10-08 MED ORDER — GLYCOPYRROLATE 0.2 MG/ML IJ SOLN
INTRAMUSCULAR | Status: AC
  Administered 2010-10-08: 0.2 mg via INTRAVENOUS
  Filled 2010-10-08: qty 1

## 2010-10-08 SURGICAL SUPPLY — 48 items
APL SKNCLS STERI-STRIP NONHPOA (GAUZE/BANDAGES/DRESSINGS) ×2
BAG HAMPER (MISCELLANEOUS) ×3 IMPLANT
BAG SPEC RTRVL LRG 6X4 10 (ENDOMECHANICALS) ×2
BENZOIN TINCTURE PRP APPL 2/3 (GAUZE/BANDAGES/DRESSINGS) ×3 IMPLANT
CLOTH BEACON ORANGE TIMEOUT ST (SAFETY) ×3 IMPLANT
COVER LIGHT HANDLE STERIS (MISCELLANEOUS) ×6 IMPLANT
CUTTER ENDO LINEAR 45M (STAPLE) ×3 IMPLANT
CUTTER LINEAR ENDO 35 ETS (STAPLE) ×1 IMPLANT
DECANTER SPIKE VIAL GLASS SM (MISCELLANEOUS) ×3 IMPLANT
DEVICE TROCAR PUNCTURE CLOSURE (ENDOMECHANICALS) ×3 IMPLANT
DISSECTOR BLUNT TIP ENDO 5MM (MISCELLANEOUS) IMPLANT
DURAPREP 26ML APPLICATOR (WOUND CARE) ×3 IMPLANT
ELECT REM PT RETURN 9FT ADLT (ELECTROSURGICAL) ×3
ELECTRODE REM PT RTRN 9FT ADLT (ELECTROSURGICAL) ×2 IMPLANT
FILTER SMOKE EVAC LAPAROSHD (FILTER) ×3 IMPLANT
FORMALIN 10 PREFIL 120ML (MISCELLANEOUS) ×3 IMPLANT
GLOVE BIOGEL PI IND STRL 7.5 (GLOVE) ×2 IMPLANT
GLOVE BIOGEL PI INDICATOR 7.5 (GLOVE) ×1
GLOVE ECLIPSE 6.5 STRL STRAW (GLOVE) ×2 IMPLANT
GLOVE ECLIPSE 7.0 STRL STRAW (GLOVE) ×3 IMPLANT
GLOVE EXAM NITRILE MD LF STRL (GLOVE) ×1 IMPLANT
GLOVE INDICATOR 7.0 STRL GRN (GLOVE) ×2 IMPLANT
GOWN BRE IMP SLV AUR XL STRL (GOWN DISPOSABLE) ×7 IMPLANT
INST SET LAPROSCOPIC AP (KITS) ×3 IMPLANT
KIT ROOM TURNOVER APOR (KITS) ×3 IMPLANT
MANIFOLD NEPTUNE II (INSTRUMENTS) ×3 IMPLANT
NDL INSUFFLATION 14GA 120MM (NEEDLE) ×2 IMPLANT
NEEDLE INSUFFLATION 14GA 120MM (NEEDLE) ×3 IMPLANT
PACK LAP CHOLE LZT030E (CUSTOM PROCEDURE TRAY) ×3 IMPLANT
PAD ARMBOARD 7.5X6 YLW CONV (MISCELLANEOUS) ×3 IMPLANT
POUCH SPECIMEN RETRIEVAL 10MM (ENDOMECHANICALS) ×3 IMPLANT
RELOAD 45 VASCULAR/THIN (ENDOMECHANICALS) IMPLANT
RELOAD STAPLE 45 2.5 WHT GRN (ENDOMECHANICALS) IMPLANT
RELOAD STAPLE 45 3.5 BLU ETS (ENDOMECHANICALS) IMPLANT
RELOAD STAPLE TA45 3.5 REG BLU (ENDOMECHANICALS) IMPLANT
SEALER TISSUE G2 CVD JAW 35 (ENDOMECHANICALS) ×2 IMPLANT
SEALER TISSUE G2 CVD JAW 45CM (ENDOMECHANICALS) ×1
SET BASIN LINEN APH (SET/KITS/TRAYS/PACK) ×3 IMPLANT
SET TUBE IRRIG SUCTION NO TIP (IRRIGATION / IRRIGATOR) IMPLANT
SLEEVE Z-THREAD 5X100MM (TROCAR) ×3 IMPLANT
STRIP CLOSURE SKIN 1/2X4 (GAUZE/BANDAGES/DRESSINGS) ×3 IMPLANT
SUT MNCRL AB 4-0 PS2 18 (SUTURE) ×3 IMPLANT
SUT VIC AB 2-0 CT2 27 (SUTURE) ×4 IMPLANT
TRAY FOLEY CATH 14FR (SET/KITS/TRAYS/PACK) ×3 IMPLANT
TROCAR Z-THRD FIOS HNDL 11X100 (TROCAR) ×3 IMPLANT
TROCAR Z-THREAD FIOS 5X100MM (TROCAR) ×3 IMPLANT
TROCAR Z-THREAD SLEEVE 11X100 (TROCAR) ×3 IMPLANT
WARMER LAPAROSCOPE (MISCELLANEOUS) ×3 IMPLANT

## 2010-10-08 NOTE — Anesthesia Preprocedure Evaluation (Addendum)
Anesthesia Evaluation  Name, MR# and DOB Patient awake  General Assessment Comment  Reviewed: Allergy & Precautions, H&P , NPO status , Patient's Chart, lab work & pertinent test results  Airway Mallampati: I  Neck ROM: Full    Dental  (+) Teeth Intact   Pulmonary    pulmonary exam normalPulmonary Exam Normal     Cardiovascular Regular Normal    Neuro/Psych   GI/Hepatic/Renal   Endo/Other    Abdominal   Musculoskeletal   Hematology   Peds  Reproductive/Obstetrics    Anesthesia Other Findings             Anesthesia Physical Anesthesia Plan  ASA: I  Anesthesia Plan: General   Post-op Pain Management:    Induction: Intravenous  Airway Management Planned: Oral ETT  Additional Equipment:   Intra-op Plan:   Post-operative Plan:   Informed Consent: I have reviewed the patients History and Physical, chart, labs and discussed the procedure including the risks, benefits and alternatives for the proposed anesthesia with the patient or authorized representative who has indicated his/her understanding and acceptance.     Plan Discussed with:   Anesthesia Plan Comments:         Anesthesia Quick Evaluation

## 2010-10-08 NOTE — Anesthesia Postprocedure Evaluation (Signed)
  Anesthesia Post-op Note  Patient: Shane Crawford  Procedure(s) Performed:  LAPAROSCOPY DIAGNOSTIC; APPENDECTOMY LAPAROSCOPIC  Patient Location: PACU  Anesthesia Type: General  Level of Consciousness: awake, alert  and oriented  Airway and Oxygen Therapy: Patient Spontanous Breathing  Post-op Pain: 7 /10  Post-op Assessment: Post-op Vital signs reviewed, Patient's Cardiovascular Status Stable and Respiratory Function Stable  Post-op Vital Signs: stable  Complications: No apparent anesthesia complications

## 2010-10-08 NOTE — Op Note (Signed)
Patient:  Shane Crawford  DOB:  04/13/1985  MRN:  [id number]   Preop Diagnosis:  Right lower quadrant abdominal pain (chronic)  Postop Diagnosis:  The same  Procedure:  Diagnostic laparoscopy and appendectomy  Surgeon:  Dr. Tilford Pillar  Anes:  General endotracheal, local anesthetic: 0.5% Sensorcaine plain  Indications:  Patient is a 25 year old male presented to my office with a history of chronic right-sided abdominal pain mostly in the right lower quadrant and right flank. He has undergone extensive workup with no evidence of acute appendiceal etiologies for colonic changes. At this point I had a long discussion with patient regarding his symptomatology and his history of the right-sided abdominal pain. I did discuss the risks benefits and alternatives of a diagnostic laparoscopy and planned appendectomy including but not limited to risk of bleeding, infection, appendiceal stump leak, small bowel injury, intraoperative cardiac and pulmonary events. At this point I also discussed at length the patient the possibility of persistent abdominal pain despite surgical intervention. He states understanding as consented for the planned procedure.  Procedure note:  Patient is taken to the OR and placed on the OR table in the supine position. At this point general anesthetic is administered once patient was asleep he was endotracheally intubated by anesthesiology. A Foley catheter is in placed in standard sterile fashion by myself. His abdomen is prepped with DuraPrep solution and draped in standard fashion. At this point a stab incision created suprapubically with 11 blade scalpel. Additional dissection down through the subcuticular tissues carried out using a Coker clamp and is then used to grasp the anterior abdominal wall fascia.  At this point varies needle is inserted, saline drop test is utilized firm intraperitoneal placement, and then pneumoperitoneum was initiated. Once sufficient  pneumoperitoneum was obtained an 11 mm process or over laparoscope allowing visualization the trocar entering into the peritoneal cavity. His point the inner cannula was removed the laparoscope was reinserted. There is no evidence a trocar or Veress needle injury. At this point the remaining trochars are placed with a 5 mm in the suprapubic region, and a 11 mm trocar in the left lateral abdominal wall. These are placed under direct visualization. Point the right lower quadrant was inspected. There is no evidence of any acute inflammatory process. The appendix is localized at the base of the cecum. The appendix appears normal grossly. A window was created between the mesial appendix and base of the appendix. An Endo GIA 35 mm stapler is utilized to divide the base the appendix. An EnSeal bipolar device was utilized to divide the mesoappendix. At this point the appendix is free is placed in Endo Catch bag. Inspection of the staple line and these appendix does not demonstrate any evidence of bleeding. As quite pleased with the appearance of the staple line. I did inspect the terminal ileum as well as the distal small bowel and did not notice any acute changes or inflammatory processes. No evidence of a Meckel's diverticulum. At this point attention was turned closure.  An Endo Close suture passing device was utilized to pass a 2-0 Vicryl sutures for both 11 mm trocar sites. With the sutures in place the appendix was retrieved and was removed through the umbilical trocar site and intact Endo Catch bag. The appendix is sent as a permanent specimen to pathology. At this point the pneumoperitoneum was evacuated and the trochars were removed. The Vicryl sutures were secured. Local anesthetic is instilled. A 4-0 Monocryl was utilized to reapproximate the skin  edges at all 3 trocar sites.  Skin was washed dried moist dry towel benzoin is applied around the incision. Half-inch Steri-Strips are placed. The drapes were removed  the patient was allowed to come out of general anesthetic and was transferred back to a regular hospital bed. He is transferred to the PACU in stable condition. At the conclusion of procedure all instrument sponge and needle counts are correct. Patient tolerated procedure extremely well.  Complications:  None  EBL:  Minimal  Specimen:  Appendix

## 2010-10-08 NOTE — Interval H&P Note (Signed)
Patient seen and evaluated in the preop holding area. There is no change from his history and physical. I did discuss risks benefits and alternatives of surgical intervention again with patient and family. We also discussed the possibility of continued chronic pain despite surgical intervention. He expresses understanding , consents to the operation, and is willing to proceed. We will proceed to the operating room as discussed.

## 2010-10-08 NOTE — Anesthesia Procedure Notes (Addendum)
Performed by: Glynn Octave   Procedure Name: Intubation Date/Time: 10/08/2010 2:35 PM Performed by: Glynn Octave Pre-anesthesia Checklist: Patient identified, Patient being monitored, Timeout performed, Emergency Drugs available and Suction available Patient Re-evaluated:Patient Re-evaluated prior to inductionOxygen Delivery Method: Circle System Utilized Preoxygenation: Pre-oxygenation with 100% oxygen Intubation Type: IV induction, Rapid sequence and Circoid Pressure applied Laryngoscope Size: Mac and 3 Grade View: Grade II Tube type: Oral Tube size: 7.0 mm Number of attempts: 1 Airway Equipment and Method: stylet Placement Confirmation: ETT inserted through vocal cords under direct vision,  positive ETCO2 and breath sounds checked- equal and bilateral Secured at: 21 cm Tube secured with: Tape Dental Injury: Teeth and Oropharynx as per pre-operative assessment

## 2010-10-08 NOTE — Transfer of Care (Signed)
Immediate Anesthesia Transfer of Care Note  Patient: Shane Crawford  Procedure(s) Performed:  LAPAROSCOPY DIAGNOSTIC; APPENDECTOMY LAPAROSCOPIC  Patient Location: PACU  Anesthesia Type: General  Level of Consciousness: awake, alert  and oriented  Airway & Oxygen Therapy: Patient Spontanous Breathing and Patient connected to face mask oxygen  Post-op Assessment: Report given to PACU RN  Post vital signs: stable  Complications: No apparent anesthesia complications

## 2010-10-12 NOTE — ED Provider Notes (Addendum)
History     CSN: [id number] Arrival date & time: 09/11/2010  8:11 PM  Chief Complaint  Patient presents with  . Abdominal Pain  . Diarrhea  . Nausea   HPI  History reviewed. No pertinent past medical history.  History reviewed. No pertinent past surgical history.  Family History  Problem Relation Age of Onset  . Anesthesia problems Neg Hx   . Hypotension Neg Hx   . Malignant hyperthermia Neg Hx   . Pseudochol deficiency Neg Hx     History  Substance Use Topics  . Smoking status: Current Everyday Smoker -- 1.0 packs/day for 7 years    Types: Cigarettes  . Smokeless tobacco: Not on file  . Alcohol Use: Yes     occassional- 1 beer q 3 weeks      Review of Systems  Physical Exam  BP 128/71  Pulse 80  Temp(Src) 98.6 F (37 C) (Oral)  Resp 14  Ht 6' (1.829 m)  Wt 288 lb (130.636 kg)  BMI 39.06 kg/m2  SpO2 96%  Physical Exam  ED Course  Procedures  MDM     Medical screening examination/treatment/procedure(s) were conducted as a shared visit with non-physician practitioner(s) and myself.  I personally evaluated the patient during the encounter.  RLQ pain;  Ct neg for appy;  No acute abd  Donnetta Hutching, MD 10/12/10 1300  Donnetta Hutching, MD 10/12/10 [phone]

## 2010-10-15 ENCOUNTER — Encounter (HOSPITAL_COMMUNITY): Payer: Self-pay | Admitting: General Surgery

## 2010-10-16 ENCOUNTER — Emergency Department (HOSPITAL_COMMUNITY): Payer: Medicaid Other

## 2010-10-16 ENCOUNTER — Encounter (HOSPITAL_COMMUNITY): Payer: Self-pay

## 2010-10-16 ENCOUNTER — Emergency Department (HOSPITAL_COMMUNITY)
Admission: EM | Admit: 2010-10-16 | Discharge: 2010-10-16 | Disposition: A | Discharge status: Home / Self Care | Payer: Medicaid Other | Attending: Emergency Medicine | Admitting: Emergency Medicine

## 2010-10-16 DIAGNOSIS — R112 Nausea with vomiting, unspecified: Secondary | ICD-10-CM | POA: Insufficient documentation

## 2010-10-16 DIAGNOSIS — F17200 Nicotine dependence, unspecified, uncomplicated: Secondary | ICD-10-CM | POA: Insufficient documentation

## 2010-10-16 DIAGNOSIS — K921 Melena: Secondary | ICD-10-CM | POA: Insufficient documentation

## 2010-10-16 DIAGNOSIS — R1032 Left lower quadrant pain: Secondary | ICD-10-CM | POA: Insufficient documentation

## 2010-10-16 DIAGNOSIS — R109 Unspecified abdominal pain: Secondary | ICD-10-CM

## 2010-10-16 DIAGNOSIS — K922 Gastrointestinal hemorrhage, unspecified: Secondary | ICD-10-CM

## 2010-10-16 LAB — CBC
HCT: 47.5 % (ref 39.0–52.0)
Hemoglobin: 16.7 g/dL (ref 13.0–17.0)
MCHC: 35.2 g/dL (ref 30.0–36.0)
RBC: 5.5 MIL/uL (ref 4.22–5.81)
WBC: 13.5 10*3/uL — ABNORMAL HIGH (ref 4.0–10.5)

## 2010-10-16 LAB — LIPASE, BLOOD: Lipase: 27 U/L (ref 11–59)

## 2010-10-16 LAB — DIFFERENTIAL
Basophils Relative: 0 % (ref 0–1)
Lymphocytes Relative: 9 % — ABNORMAL LOW (ref 12–46)
Monocytes Relative: 5 % (ref 3–12)
Neutro Abs: 11.6 10*3/uL — ABNORMAL HIGH (ref 1.7–7.7)
Neutrophils Relative %: 86 % — ABNORMAL HIGH (ref 43–77)

## 2010-10-16 LAB — COMPREHENSIVE METABOLIC PANEL
Albumin: 4.2 g/dL (ref 3.5–5.2)
BUN: 10 mg/dL (ref 6–23)
Calcium: 10.1 mg/dL (ref 8.4–10.5)
Creatinine, Ser: 0.74 mg/dL (ref 0.50–1.35)
GFR calc Af Amer: >60 mL/min (ref >60)
Glucose, Bld: 91 mg/dL (ref 70–99)
Total Protein: 8 g/dL (ref 6.0–8.3)

## 2010-10-16 MED ORDER — MORPHINE SULFATE 4 MG/ML IJ SOLN
4.0000 mg | Freq: Once | INTRAMUSCULAR | Status: AC
  Administered 2010-10-16: 4 mg via INTRAVENOUS
  Filled 2010-10-16: qty 1

## 2010-10-16 MED ORDER — SODIUM CHLORIDE 0.9 % IV SOLN
INTRAVENOUS | Status: DC
  Administered 2010-10-16: 21:00:00 via INTRAVENOUS

## 2010-10-16 MED ORDER — OXYCODONE-ACETAMINOPHEN 5-325 MG PO TABS: 1.0000 | ORAL_TABLET | ORAL | Status: AC | PRN

## 2010-10-16 MED ORDER — ONDANSETRON HCL 4 MG PO TABS: 4.0000 mg | ORAL_TABLET | Freq: Four times a day (QID) | ORAL | Status: AC

## 2010-10-16 MED ORDER — ONDANSETRON HCL 4 MG/2ML IJ SOLN
4.0000 mg | Freq: Once | INTRAMUSCULAR | Status: AC
  Administered 2010-10-16: 4 mg via INTRAVENOUS
  Filled 2010-10-16: qty 2

## 2010-10-16 MED ORDER — SODIUM CHLORIDE 0.9 % IV BOLUS (SEPSIS)
500.0000 mL | Freq: Once | INTRAVENOUS | Status: AC
  Administered 2010-10-16: 500 mL via INTRAVENOUS

## 2010-10-16 MED ORDER — MORPHINE SULFATE 4 MG/ML IJ SOLN
4.0000 mg | Freq: Once | INTRAMUSCULAR | Status: AC
  Administered 2010-10-16: 4 mg via INTRAVENOUS
  Filled 2010-10-16: qty 4

## 2010-10-16 MED ORDER — IOHEXOL 300 MG/ML  SOLN
100.0000 mL | Freq: Once | INTRAMUSCULAR | Status: AC | PRN
  Administered 2010-10-16: 100 mL via INTRAVENOUS

## 2010-10-16 NOTE — ED Notes (Signed)
Pt has finished drinking his contrast for ct. Pt states he is nauseated and still hurting. EDP aware.

## 2010-10-16 NOTE — ED Notes (Signed)
Pt reports having an appendectomy last 08/13.  Pt has been having incisional pain to the furthest left site.  Pt also reports blood clots in his stool that began today and vomiting that began last night.  Pt denies any fever.

## 2010-10-16 NOTE — ED Notes (Signed)
Pt reports current condition. Was told by pcp to by checked out in the er. Pt has completed contrast & now awaiting ct scan.

## 2010-10-16 NOTE — ED Provider Notes (Addendum)
History     CSN: [id number] Arrival date & time: 10/16/2010  4:51 PM Scribed for [employer]. Josua Ferrebee, MD, the patient was seen in room APA04/[facility]04. This chart was scribed by Katha Cabal. This patient's care was started at 5:31PM.    Chief Complaint  Patient presents with  . Post-op Problem  . Emesis  . Rectal Bleeding   HPI Shane Crawford is a 25 y.o. male who presents to the Emergency Department complaining of moderate LLQ abdominal pain that was mild since appendectomy on 8/13 but pain got worse in the last couple days. Pain is associated with stomach cramps, yellow emesis, hematochezia. Denies denies hematemesis, hematuria, and usual blood while brusing teeth.  Tolerated dinner last night.   Pt states "overdid it after surgery".  HPI ELEMENTS:  Location: LLQ  Onset: pain worsened several days ago Duration: persistent since onset  Severity: moderate Context: as above  Associated symptoms: tomach cramps, yellow emesis, hematochezia. Denies denies hematemesis, hematuria, and usual blood while brusing teeth.  PAST MEDICAL HISTORY:  History reviewed. No pertinent past medical history.  PAST SURGICAL HISTORY:  Past Surgical History  Procedure Date  . Laparoscopy 10/08/2010    Procedure: LAPAROSCOPY DIAGNOSTIC;  Surgeon: Fabio Bering;  Location: [facility];  Service: General;  Laterality: N/A;  . Laparoscopic appendectomy 10/08/2010    Procedure: APPENDECTOMY LAPAROSCOPIC;  Surgeon: Fabio Bering;  Location: [facility];  Service: General;  Laterality: N/A;    MEDICATIONS:  Previous Medications   BISMUTH SUBSALICYLATE (PEPTO-BISMOL PO)    Take 10 mLs by mouth once as needed. For stomach pain    IBUPROFEN (ADVIL,MOTRIN) 200 MG TABLET    Take 600 mg by mouth every 6 (six) hours as needed. For headache   OXYCODONE-ACETAMINOPHEN (PERCOCET) 5-325 MG PER TABLET    Take one-two tabs q 4-6 hrs prn pain     ALLERGIES:  Allergies as of 10/16/2010  . (No Known Allergies)     FAMILY  HISTORY:  Family History  Problem Relation Age of Onset  . Anesthesia problems Neg Hx   . Hypotension Neg Hx   . Malignant hyperthermia Neg Hx   . Pseudochol deficiency Neg Hx      SOCIAL HISTORY: History   Social History  . Marital Status: Single    Spouse Name: N/A    Number of Children: N/A  . Years of Education: N/A   Social History Main Topics  . Smoking status: Current Everyday Smoker -- 1.0 packs/day for 7 years    Types: Cigarettes  . Smokeless tobacco: None  . Alcohol Use: Yes     occassional- 1 beer q 3 weeks  . Drug Use: No  . Sexually Active:    Other Topics Concern  . None   Social History Narrative  . None    Review of Systems 10 Systems reviewed and are negative for acute change except as noted in the HPI.  Physical Exam  BP 139/78  Pulse 98  Temp(Src) 98.4 F (36.9 C) (Oral)  Resp 20  Ht 6' (1.829 m)  Wt 288 lb (130.636 kg)  BMI 39.06 kg/m2  SpO2 100%  Physical Exam  Nursing note and vitals reviewed. Constitutional: He is oriented to person, place, and time. He appears well-developed and well-nourished.  HENT:  Head: Normocephalic and atraumatic.  Neck: Neck supple.  Cardiovascular: Normal rate, regular rhythm and normal heart sounds.  Exam reveals no gallop and no friction rub.   No murmur heard. Pulmonary/Chest: Effort normal  and breath sounds normal. No respiratory distress. He has no wheezes.  Abdominal: Soft. Bowel sounds are normal. There is tenderness (LLQ). There is no rebound and no guarding.       Incision intact,   Genitourinary: Rectal exam shows no mass and no tenderness.  Neurological: He is alert and oriented to person, place, and time.  Skin: Skin is warm and dry. No pallor.  Psychiatric: He has a normal mood and affect. His behavior is normal.    ED Course  Procedures OTHER DATA REVIEWED: Nursing notes, vital signs, and past medical records reviewed.   LABS / RADIOLOGY:  Results for orders placed during the  hospital encounter of 10/16/10  COMPREHENSIVE METABOLIC PANEL      Component Value Range   Sodium 138  135 - 145 (mEq/L)   Potassium 3.8  3.5 - 5.1 (mEq/L)   Chloride 101  96 - 112 (mEq/L)   CO2 26  19 - 32 (mEq/L)   Glucose, Bld 91  70 - 99 (mg/dL)   BUN 10  6 - 23 (mg/dL)   Creatinine, Ser 2.44  0.50 - 1.35 (mg/dL)   Calcium 01.0  8.4 - 10.5 (mg/dL)   Total Protein 8.0  6.0 - 8.3 (g/dL)   Albumin 4.2  3.5 - 5.2 (g/dL)   AST 25  0 - 37 (U/L)   ALT 37  0 - 53 (U/L)   Alkaline Phosphatase 88  39 - 117 (U/L)   Total Bilirubin 0.6  0.3 - 1.2 (mg/dL)   GFR calc non Af Amer >60  >60 (mL/min)   GFR calc Af Amer >60  >60 (mL/min)  LIPASE, BLOOD      Component Value Range   Lipase 27  11 - 59 (U/L)  CBC      Component Value Range   WBC 13.5 (*) 4.0 - 10.5 (K/uL)   RBC 5.50  4.22 - 5.81 (MIL/uL)   Hemoglobin 16.7  13.0 - 17.0 (g/dL)   HCT 27.2  53.6 - 64.4 (%)   MCV 86.4  78.0 - 100.0 (fL)   MCH 30.4  26.0 - 34.0 (pg)   MCHC 35.2  30.0 - 36.0 (g/dL)   RDW 03.4  74.2 - 59.5 (%)   Platelets 243  150 - 400 (K/uL)  DIFFERENTIAL      Component Value Range   Neutrophils Relative 86 (*) 43 - 77 (%)   Neutro Abs 11.6 (*) 1.7 - 7.7 (K/uL)   Lymphocytes Relative 9 (*) 12 - 46 (%)   Lymphs Abs 1.2  0.7 - 4.0 (K/uL)   Monocytes Relative 5  3 - 12 (%)   Monocytes Absolute 0.7  0.1 - 1.0 (K/uL)   Eosinophils Relative 0  0 - 5 (%)   Eosinophils Absolute 0.0  0.0 - 0.7 (K/uL)   Basophils Relative 0  0 - 1 (%)   Basophils Absolute 0.0  0.0 - 0.1 (K/uL)     Ct Abdomen Pelvis W Contrast  10/16/2010  *RADIOLOGY REPORT*  Clinical Data: Left lower quadrant pain.  GI bleed.  CT ABDOMEN AND PELVIS WITH CONTRAST  Technique:  Multidetector CT imaging of the abdomen and pelvis was performed following the standard protocol during bolus administration of intravenous contrast.  Contrast: 100 ml Omnipaque-300  Comparison: CT from 09/11/2010  Findings: Lung bases are clear.   There is a large amount of free  air in the upper anterior midline of the abdomen.  This may be within the omentum.  There is a smaller amount of free air below the umbilicus at the midline.  Normal appearance of the liver, portal venous system, gallbladder, pancreas, adrenals and kidneys. There may be small cysts in the right kidney.  There is no evidence for free fluid or an abscess.  Fluid in the urinary bladder. Normal appearance of prostate and seminal vesicles.  There is wall thickening of the transverse colon which could be related to under distention.  No acute bony abnormality.  IMPRESSION: Large amount of free air along the anterior abdominal midline.  The findings may be related to the recent surgery.  There is no evidence to suggest an acute visceral perforation.  Mild wall thickening of the transverse colon.  Findings could be related to under distention of the colon.  However, based on the history of GI bleeding, an early or mild colitis cannot be excluded.  Original Report Authenticated By: Richarda Overlie, M.D.    MDM: abdominal pain. Some nausea and vomiting. Recent appy. Blood in stool without diarrhhea. CT showed no reason for LLQ tenderness. Possible colitis. D/w Dr Lovell Sheehan. Can see Dr Suzette Battiest on Thursday.   MEDICATIONS GIVEN IN THE E.D.  Medications  Bismuth Subsalicylate (PEPTO-BISMOL PO) (not administered)  0.9 %  sodium chloride infusion (not administered)  morphine injection 4 mg (not administered)  ondansetron (ZOFRAN) injection 4 mg (not administered)  sodium chloride 0.9 % bolus 500 mL (500 mL Intravenous Given 10/16/10 1756)  morphine injection 4 mg (4 mg Intravenous Given 10/16/10 1756)  ondansetron (ZOFRAN) injection 4 mg (4 mg Intravenous Given 10/16/10 1757)  morphine injection 4 mg (4 mg Intravenous Given 10/16/10 1835)  iohexol (OMNIPAQUE) 300 MG/ML injection 100 mL (100 mL Intravenous Contrast Given 10/16/10 1940)    I personally performed the services described in this documentation, which was scribed in  my presence. The recorded information has been reviewed and considered. Juliet Rude. Rubin Payor, MD        Juliet Rude. Rubin Payor, MD 10/16/10 2159  Juliet Rude. Rubin Payor, MD 10/16/10 2159

## 2010-10-16 NOTE — ED Notes (Signed)
Pt c/o severe abd pain since Thursday and last night he started vomiting and states there is pink substance when he wipes after having diarrhea.

## 2010-10-22 MED ORDER — SODIUM CHLORIDE 0.45 % IV SOLN
Freq: Once | INTRAVENOUS | Status: AC
  Administered 2010-10-23: 10:00:00 via INTRAVENOUS

## 2010-10-22 NOTE — H&P (Signed)
Shane Crawford is an 24 y.o. male.   Chief Complaint: hematochezia HPI: s/p laparoscopic appendectomy recently who now is experiencing left sided abdominal crampying with hematochezia.  Started last week.  Is variable.  No past medical history on file.  Past Surgical History  Procedure Date  . Laparoscopy 10/08/2010    Procedure: LAPAROSCOPY DIAGNOSTIC;  Surgeon: Brent C Ziegler;  Location: [facility];  Service: General;  Laterality: N/A;  . Laparoscopic appendectomy 10/08/2010    Procedure: APPENDECTOMY LAPAROSCOPIC;  Surgeon: Brent C Ziegler;  Location: [facility];  Service: General;  Laterality: N/A;    Family History  Problem Relation Age of Onset  . Anesthesia problems Neg Hx   . Hypotension Neg Hx   . Malignant hyperthermia Neg Hx   . Pseudochol deficiency Neg Hx    Social History:  reports that he has been smoking Cigarettes.  He has a 7 pack-year smoking history. He does not have any smokeless tobacco history on file. He reports that he drinks alcohol. He reports that he does not use illicit drugs.  Allergies: No Known Allergies  Medications Prior to Admission  Medication Dose Route Frequency Provider Last Rate Last Dose  . DISCONTD: 0.9 %  sodium chloride infusion   Intravenous Continuous Nathan R. Pickering, MD       Medications Prior to Admission  Medication Sig Dispense Refill  . Bismuth Subsalicylate (PEPTO-BISMOL PO) Take 10 mLs by mouth once as needed. For stomach pain       . ibuprofen (ADVIL,MOTRIN) 200 MG tablet Take 600 mg by mouth every 6 (six) hours as needed. For headache      . ondansetron (ZOFRAN) 4 MG tablet Take 1 tablet (4 mg total) by mouth every 6 (six) hours.  12 tablet  0  . oxyCODONE-acetaminophen (PERCOCET) 5-325 MG per tablet Take one-two tabs q 4-6 hrs prn pain  45 tablet  0  . oxyCODONE-acetaminophen (PERCOCET) 5-325 MG per tablet Take 1 tablet by mouth every 4 (four) hours as needed for pain.  15 tablet  0    No results found for this or any  previous visit (from the past 48 hour(s)). No results found.  Review of Systems  Constitutional: Negative.   HENT: Negative.   Eyes: Negative.   Respiratory: Negative.   Cardiovascular: Negative.   Gastrointestinal: Positive for abdominal pain and blood in stool.  Genitourinary: Negative.   Musculoskeletal: Negative.   Skin: Negative.   Neurological: Negative.   Endo/Heme/Allergies: Negative.   Psychiatric/Behavioral: Negative.     There were no vitals taken for this visit. Physical Exam  Constitutional: He is oriented to person, place, and time. He appears well-developed and well-nourished.  HENT:  Head: Normocephalic and atraumatic.  Eyes: Pupils are equal, round, and reactive to light.  Neck: Normal range of motion. Neck supple.  Cardiovascular: Normal rate, regular rhythm and normal heart sounds.   Respiratory: Effort normal and breath sounds normal.  GI: Soft. Bowel sounds are normal. There is tenderness.       Left sided abdominal pain.  Neurological: He is alert and oriented to person, place, and time.  Skin: Skin is warm and dry.     Assessment/Plan Assessment:  Hematochezia Plan:  For TCS on 10/23/10.    Serenity Fortner A 10/22/2010, 12:14 PM    

## 2010-10-23 ENCOUNTER — Encounter (HOSPITAL_COMMUNITY)
Admission: RE | Disposition: A | Discharge status: Home / Self Care | Payer: Self-pay | Source: Ambulatory Visit | Attending: General Surgery

## 2010-10-23 ENCOUNTER — Ambulatory Visit (HOSPITAL_COMMUNITY)
Admission: RE | Admit: 2010-10-23 | Discharge: 2010-10-23 | Disposition: A | Discharge status: Home / Self Care | Payer: Medicaid Other | Source: Ambulatory Visit | Attending: General Surgery | Admitting: General Surgery

## 2010-10-23 ENCOUNTER — Encounter (HOSPITAL_COMMUNITY): Payer: Self-pay | Admitting: *Deleted

## 2010-10-23 DIAGNOSIS — K921 Melena: Secondary | ICD-10-CM | POA: Insufficient documentation

## 2010-10-23 HISTORY — PX: COLONOSCOPY: SHX5424

## 2010-10-23 SURGERY — COLONOSCOPY
Anesthesia: Moderate Sedation

## 2010-10-23 MED ORDER — MEPERIDINE HCL 25 MG/ML IJ SOLN
INTRAMUSCULAR | Status: DC | PRN
  Administered 2010-10-23: 25 mg via INTRAVENOUS
  Administered 2010-10-23: 50 mg via INTRAVENOUS

## 2010-10-23 MED ORDER — MIDAZOLAM HCL 5 MG/5ML IJ SOLN
INTRAMUSCULAR | Status: DC | PRN
  Administered 2010-10-23: 2 mg via INTRAVENOUS
  Administered 2010-10-23: 4 mg via INTRAVENOUS
  Administered 2010-10-23: 1 mg via INTRAVENOUS

## 2010-10-23 MED ORDER — MEPERIDINE HCL 100 MG/ML IJ SOLN
INTRAMUSCULAR | Status: AC
  Filled 2010-10-23: qty 2

## 2010-10-23 MED ORDER — MIDAZOLAM HCL 5 MG/5ML IJ SOLN
INTRAMUSCULAR | Status: AC
  Filled 2010-10-23: qty 10

## 2010-10-30 ENCOUNTER — Other Ambulatory Visit (HOSPITAL_COMMUNITY): Payer: Self-pay | Admitting: General Surgery

## 2010-10-30 DIAGNOSIS — R1011 Right upper quadrant pain: Secondary | ICD-10-CM

## 2010-10-31 ENCOUNTER — Encounter (HOSPITAL_COMMUNITY)
Admission: RE | Admit: 2010-10-31 | Discharge: 2010-10-31 | Disposition: A | Discharge status: Home / Self Care | Payer: Medicaid Other | Source: Ambulatory Visit | Attending: General Surgery | Admitting: General Surgery

## 2010-10-31 ENCOUNTER — Encounter (HOSPITAL_COMMUNITY): Payer: Self-pay

## 2010-10-31 DIAGNOSIS — R1011 Right upper quadrant pain: Secondary | ICD-10-CM

## 2010-10-31 DIAGNOSIS — R109 Unspecified abdominal pain: Secondary | ICD-10-CM | POA: Insufficient documentation

## 2010-10-31 MED ORDER — SINCALIDE 5 MCG IJ SOLR
0.0200 ug/kg | Freq: Once | INTRAMUSCULAR | Status: AC
  Administered 2010-10-31: 2.57 ug via INTRAVENOUS
  Filled 2010-10-31: qty 1

## 2010-10-31 MED ORDER — TECHNETIUM TC 99M MEBROFENIN IV KIT
5.0000 | PACK | Freq: Once | INTRAVENOUS | Status: AC | PRN
  Administered 2010-10-31: 5.1 via INTRAVENOUS

## 2010-11-01 ENCOUNTER — Encounter (HOSPITAL_COMMUNITY): Payer: Self-pay | Admitting: General Surgery

## 2010-11-05 ENCOUNTER — Encounter (HOSPITAL_COMMUNITY): Payer: Self-pay

## 2010-11-05 ENCOUNTER — Encounter (HOSPITAL_COMMUNITY)
Admission: RE | Admit: 2010-11-05 | Discharge: 2010-11-05 | Disposition: A | Discharge status: Home / Self Care | Payer: Medicaid Other | Source: Ambulatory Visit | Attending: General Surgery | Admitting: General Surgery

## 2010-11-05 NOTE — Patient Instructions (Addendum)
[name]  11/05/2010   Your procedure is scheduled on:  11-07-2010  Report to [facility] at  615  AM.  Call this number if you have problems the morning of surgery: [phone]   Remember:   Do not eat food:After Midnight.  Do not drink clear liquids: After Midnight.  Take these medicines the morning of surgery with A SIP OF WATER: percocet   Do not wear jewelry, make-up or nail polish.  Do not wear lotions, powders, or perfumes. You may wear deodorant.  Do not shave 48 hours prior to surgery.  Do not bring valuables to the hospital.  Contacts, dentures or bridgework may not be worn into surgery.  Leave suitcase in the car. After surgery it may be brought to your room.  For patients admitted to the hospital, checkout time is 11:00 AM the day of discharge.   Patients discharged the day of surgery will not be allowed to drive home.  Name and phone number of your driver: family  Special Instructions: CHG Shower Use Special Wash: 1/2 bottle night before surgery and 1/2 bottle morning of surgery.   Please read over the following fact sheets that you were given: Pain Booklet, MRSA Information, Surgical Site Infection Prevention, Anesthesia Post-op Instructions and Care and Recovery After Surgery PATIENT INSTRUCTIONS POST-ANESTHESIA  IMMEDIATELY FOLLOWING SURGERY:  Do not drive or operate machinery for the first twenty four hours after surgery.  Do not make any important decisions for twenty four hours after surgery or while taking narcotic pain medications or sedatives.  If you develop intractable nausea and vomiting or a severe headache please notify your doctor immediately.  FOLLOW-UP:  Please make an appointment with your surgeon as instructed. You do not need to follow up with anesthesia unless specifically instructed to do so.  WOUND CARE INSTRUCTIONS (if applicable):  Keep a dry clean dressing on the anesthesia/puncture wound site if there is drainage.  Once the wound has  quit draining you may leave it open to air.  Generally you should leave the bandage intact for twenty four hours unless there is drainage.  If the epidural site drains for more than 36-48 hours please call the anesthesia department.  QUESTIONS?:  Please feel free to call your physician or the hospital operator if you have any questions, and they will be happy to assist you.     [facility] Anesthesia Department [address] [place] [phone]

## 2010-11-07 ENCOUNTER — Encounter (HOSPITAL_COMMUNITY): Payer: Self-pay | Admitting: Anesthesiology

## 2010-11-07 ENCOUNTER — Ambulatory Visit (HOSPITAL_COMMUNITY)
Admission: RE | Admit: 2010-11-07 | Discharge: 2010-11-07 | Disposition: A | Discharge status: Home / Self Care | Payer: Medicaid Other | Source: Ambulatory Visit | Attending: General Surgery | Admitting: General Surgery

## 2010-11-07 ENCOUNTER — Other Ambulatory Visit: Payer: Self-pay | Admitting: General Surgery

## 2010-11-07 ENCOUNTER — Encounter (HOSPITAL_COMMUNITY): Payer: Self-pay | Admitting: *Deleted

## 2010-11-07 ENCOUNTER — Ambulatory Visit (HOSPITAL_COMMUNITY): Payer: Medicaid Other | Admitting: Anesthesiology

## 2010-11-07 ENCOUNTER — Encounter (HOSPITAL_COMMUNITY)
Admission: RE | Disposition: A | Discharge status: Home / Self Care | Payer: Self-pay | Source: Ambulatory Visit | Attending: General Surgery

## 2010-11-07 DIAGNOSIS — K811 Chronic cholecystitis: Secondary | ICD-10-CM

## 2010-11-07 HISTORY — PX: CHOLECYSTECTOMY: SHX55

## 2010-11-07 SURGERY — LAPAROSCOPIC CHOLECYSTECTOMY
Anesthesia: General | Wound class: Clean Contaminated

## 2010-11-07 MED ORDER — LIDOCAINE HCL (PF) 1 % IJ SOLN
INTRAMUSCULAR | Status: AC
  Filled 2010-11-07: qty 5

## 2010-11-07 MED ORDER — ONDANSETRON HCL 4 MG/2ML IJ SOLN
INTRAMUSCULAR | Status: AC
  Administered 2010-11-07: 4 mg via INTRAVENOUS
  Filled 2010-11-07: qty 2

## 2010-11-07 MED ORDER — FENTANYL CITRATE 0.05 MG/ML IJ SOLN
INTRAMUSCULAR | Status: AC
  Filled 2010-11-07: qty 5

## 2010-11-07 MED ORDER — GLYCOPYRROLATE 0.2 MG/ML IJ SOLN
INTRAMUSCULAR | Status: AC
  Filled 2010-11-07: qty 1

## 2010-11-07 MED ORDER — CEFAZOLIN SODIUM-DEXTROSE 2-3 GM-% IV SOLR: 2.0000 g | INTRAVENOUS | Status: DC

## 2010-11-07 MED ORDER — BUPIVACAINE HCL (PF) 0.5 % IJ SOLN
INTRAMUSCULAR | Status: AC
  Filled 2010-11-07: qty 30

## 2010-11-07 MED ORDER — SUCCINYLCHOLINE CHLORIDE 20 MG/ML IJ SOLN
INTRAMUSCULAR | Status: DC | PRN
  Administered 2010-11-07: 180 mg via INTRAVENOUS

## 2010-11-07 MED ORDER — ROCURONIUM BROMIDE 100 MG/10ML IV SOLN
INTRAVENOUS | Status: DC | PRN
  Administered 2010-11-07: 40 mg via INTRAVENOUS
  Administered 2010-11-07: 10 mg via INTRAVENOUS

## 2010-11-07 MED ORDER — GLYCOPYRROLATE 0.2 MG/ML IJ SOLN
INTRAMUSCULAR | Status: AC
  Administered 2010-11-07: 0.2 mg via INTRAVENOUS
  Filled 2010-11-07: qty 1

## 2010-11-07 MED ORDER — BUPIVACAINE HCL (PF) 0.5 % IJ SOLN
INTRAMUSCULAR | Status: DC | PRN
  Administered 2010-11-07: 10 mL

## 2010-11-07 MED ORDER — MIDAZOLAM HCL 2 MG/2ML IJ SOLN
INTRAMUSCULAR | Status: AC
  Filled 2010-11-07: qty 2

## 2010-11-07 MED ORDER — PROPOFOL 10 MG/ML IV EMUL
INTRAVENOUS | Status: DC | PRN
  Administered 2010-11-07: 180 mg via INTRAVENOUS

## 2010-11-07 MED ORDER — FENTANYL CITRATE 0.05 MG/ML IJ SOLN
INTRAMUSCULAR | Status: DC | PRN
  Administered 2010-11-07: 150 ug via INTRAVENOUS
  Administered 2010-11-07: 100 ug via INTRAVENOUS

## 2010-11-07 MED ORDER — CEFAZOLIN SODIUM 1-5 GM-% IV SOLN
INTRAVENOUS | Status: AC
  Filled 2010-11-07: qty 100

## 2010-11-07 MED ORDER — NEOSTIGMINE METHYLSULFATE 1 MG/ML IJ SOLN
INTRAMUSCULAR | Status: AC
  Filled 2010-11-07: qty 10

## 2010-11-07 MED ORDER — FENTANYL CITRATE 0.05 MG/ML IJ SOLN
INTRAMUSCULAR | Status: AC
  Administered 2010-11-07: 50 ug via INTRAVENOUS
  Filled 2010-11-07: qty 2

## 2010-11-07 MED ORDER — MIDAZOLAM HCL 5 MG/5ML IJ SOLN
INTRAMUSCULAR | Status: DC | PRN
  Administered 2010-11-07: 2 mg via INTRAVENOUS

## 2010-11-07 MED ORDER — LACTATED RINGERS IV SOLN: INTRAVENOUS | Status: DC

## 2010-11-07 MED ORDER — NEOSTIGMINE METHYLSULFATE 1 MG/ML IJ SOLN
INTRAMUSCULAR | Status: DC | PRN
  Administered 2010-11-07: 4 mg via INTRAMUSCULAR

## 2010-11-07 MED ORDER — GLYCOPYRROLATE 0.2 MG/ML IJ SOLN
0.2000 mg | Freq: Once | INTRAMUSCULAR | Status: AC
  Administered 2010-11-07: 0.2 mg via INTRAVENOUS

## 2010-11-07 MED ORDER — SUCCINYLCHOLINE CHLORIDE 20 MG/ML IJ SOLN
INTRAMUSCULAR | Status: AC
  Filled 2010-11-07: qty 1

## 2010-11-07 MED ORDER — LACTATED RINGERS IV SOLN
INTRAVENOUS | Status: DC | PRN
  Administered 2010-11-07 (×2): via INTRAVENOUS

## 2010-11-07 MED ORDER — CELECOXIB 100 MG PO CAPS: 400.0000 mg | ORAL_CAPSULE | Freq: Every day | ORAL | Status: DC

## 2010-11-07 MED ORDER — ENOXAPARIN SODIUM 40 MG/0.4ML ~~LOC~~ SOLN
40.0000 mg | Freq: Once | SUBCUTANEOUS | Status: AC
  Administered 2010-11-07: 40 mg via SUBCUTANEOUS

## 2010-11-07 MED ORDER — LIDOCAINE HCL 1 % IJ SOLN
INTRAMUSCULAR | Status: DC | PRN
  Administered 2010-11-07: 50 mg via INTRADERMAL

## 2010-11-07 MED ORDER — FENTANYL CITRATE 0.05 MG/ML IJ SOLN
25.0000 ug | INTRAMUSCULAR | Status: DC | PRN
  Administered 2010-11-07 (×2): 50 ug via INTRAVENOUS

## 2010-11-07 MED ORDER — GLYCOPYRROLATE 0.2 MG/ML IJ SOLN
INTRAMUSCULAR | Status: DC | PRN
  Administered 2010-11-07: .4 mg via INTRAVENOUS

## 2010-11-07 MED ORDER — ENOXAPARIN SODIUM 40 MG/0.4ML ~~LOC~~ SOLN
SUBCUTANEOUS | Status: AC
  Administered 2010-11-07: 40 mg via SUBCUTANEOUS
  Filled 2010-11-07: qty 0.4

## 2010-11-07 MED ORDER — ROCURONIUM BROMIDE 50 MG/5ML IV SOLN
INTRAVENOUS | Status: AC
  Filled 2010-11-07: qty 1

## 2010-11-07 MED ORDER — MIDAZOLAM HCL 2 MG/2ML IJ SOLN
INTRAMUSCULAR | Status: AC
  Administered 2010-11-07: 2 mg via INTRAVENOUS
  Filled 2010-11-07: qty 2

## 2010-11-07 MED ORDER — OXYCODONE-ACETAMINOPHEN 5-325 MG PO TABS: ORAL_TABLET | ORAL | Status: DC

## 2010-11-07 MED ORDER — PROPOFOL 10 MG/ML IV EMUL
INTRAVENOUS | Status: AC
  Filled 2010-11-07: qty 20

## 2010-11-07 MED ORDER — LACTATED RINGERS IV SOLN
INTRAVENOUS | Status: DC
  Administered 2010-11-07: 50 mL via INTRAVENOUS

## 2010-11-07 MED ORDER — SODIUM CHLORIDE 0.9 % IR SOLN
Status: DC | PRN
  Administered 2010-11-07: 1000 mL

## 2010-11-07 MED ORDER — ONDANSETRON HCL 4 MG/2ML IJ SOLN
4.0000 mg | Freq: Once | INTRAMUSCULAR | Status: AC
  Administered 2010-11-07: 4 mg via INTRAVENOUS

## 2010-11-07 MED ORDER — CEFAZOLIN SODIUM 1-5 GM-% IV SOLN
INTRAVENOUS | Status: DC | PRN
  Administered 2010-11-07 (×2): 1 g via INTRAVENOUS

## 2010-11-07 MED ORDER — ONDANSETRON HCL 4 MG/2ML IJ SOLN: 4.0000 mg | Freq: Once | INTRAMUSCULAR | Status: DC | PRN

## 2010-11-07 MED ORDER — MIDAZOLAM HCL 2 MG/2ML IJ SOLN
1.0000 mg | INTRAMUSCULAR | Status: DC | PRN
  Administered 2010-11-07 (×2): 2 mg via INTRAVENOUS

## 2010-11-07 SURGICAL SUPPLY — 38 items
APL SKNCLS STERI-STRIP NONHPOA (GAUZE/BANDAGES/DRESSINGS) ×1
APPLIER CLIP UNV 5X34 EPIX (ENDOMECHANICALS) ×2 IMPLANT
APR XCLPCLP 20M/L UNV 34X5 (ENDOMECHANICALS) ×1
BAG HAMPER (MISCELLANEOUS) ×2 IMPLANT
BAG SPEC RTRVL LRG 6X4 10 (ENDOMECHANICALS) ×1
BENZOIN TINCTURE PRP APPL 2/3 (GAUZE/BANDAGES/DRESSINGS) ×2 IMPLANT
CLOTH BEACON ORANGE TIMEOUT ST (SAFETY) ×2 IMPLANT
COVER LIGHT HANDLE STERIS (MISCELLANEOUS) ×4 IMPLANT
DECANTER SPIKE VIAL GLASS SM (MISCELLANEOUS) ×2 IMPLANT
DEVICE TROCAR PUNCTURE CLOSURE (ENDOMECHANICALS) ×2 IMPLANT
DURAPREP 26ML APPLICATOR (WOUND CARE) ×2 IMPLANT
ELECT REM PT RETURN 9FT ADLT (ELECTROSURGICAL) ×2
ELECTRODE REM PT RTRN 9FT ADLT (ELECTROSURGICAL) ×1 IMPLANT
FILTER SMOKE EVAC LAPAROSHD (FILTER) ×2 IMPLANT
FORMALIN 10 PREFIL 120ML (MISCELLANEOUS) ×2 IMPLANT
GLOVE BIOGEL PI IND STRL 7.5 (GLOVE) ×1 IMPLANT
GLOVE BIOGEL PI INDICATOR 7.5 (GLOVE) ×1
GLOVE ECLIPSE 7.0 STRL STRAW (GLOVE) ×2 IMPLANT
GLOVE INDICATOR 7.0 STRL GRN (GLOVE) ×1 IMPLANT
GLOVE SS BIOGEL STRL SZ 6.5 (GLOVE) IMPLANT
GLOVE SUPERSENSE BIOGEL SZ 6.5 (GLOVE) ×1
GOWN BRE IMP SLV AUR XL STRL (GOWN DISPOSABLE) ×6 IMPLANT
HEMOSTAT SNOW SURGICEL 2X4 (HEMOSTASIS) ×2 IMPLANT
INST SET LAPROSCOPIC AP (KITS) ×2 IMPLANT
IV NS IRRIG 3000ML ARTHROMATIC (IV SOLUTION) ×1 IMPLANT
KIT ROOM TURNOVER APOR (KITS) ×2 IMPLANT
KIT TROCAR LAP CHOLE (TROCAR) ×2 IMPLANT
MANIFOLD NEPTUNE II (INSTRUMENTS) ×2 IMPLANT
PACK LAP CHOLE LZT030E (CUSTOM PROCEDURE TRAY) ×2 IMPLANT
PAD ARMBOARD 7.5X6 YLW CONV (MISCELLANEOUS) ×2 IMPLANT
POUCH SPECIMEN RETRIEVAL 10MM (ENDOMECHANICALS) ×2 IMPLANT
SET BASIN LINEN APH (SET/KITS/TRAYS/PACK) ×2 IMPLANT
SET TUBE IRRIG SUCTION NO TIP (IRRIGATION / IRRIGATOR) IMPLANT
SLEEVE Z-THREAD 5X100MM (TROCAR) ×2 IMPLANT
STRIP CLOSURE SKIN 1/2X4 (GAUZE/BANDAGES/DRESSINGS) ×2 IMPLANT
SUT MNCRL AB 4-0 PS2 18 (SUTURE) ×4 IMPLANT
SUT VIC AB 2-0 CT2 27 (SUTURE) ×4 IMPLANT
WARMER LAPAROSCOPE (MISCELLANEOUS) ×2 IMPLANT

## 2010-11-07 NOTE — Anesthesia Preprocedure Evaluation (Addendum)
Anesthesia Evaluation  Name, MR# and DOB Patient awake  General Assessment Comment  Reviewed: Allergy & Precautions, H&P , NPO status , Patient's Chart, lab work & pertinent test results  History of Anesthesia Complications Negative for: history of anesthetic complications  Airway Mallampati: I      Dental  (+) Teeth Intact   Pulmonary  clear to auscultation  breath sounds clear to auscultation none    Cardiovascular Regular Normal    Neuro/Psych   GI/Hepatic/Renal   Endo/Other    Abdominal (+) obese,   Musculoskeletal   Hematology   Peds  Reproductive/Obstetrics    Anesthesia Other Findings             Anesthesia Physical Anesthesia Plan  ASA: I  Anesthesia Plan: General   Post-op Pain Management:    Induction: Intravenous, Rapid sequence and Cricoid pressure planned  Airway Management Planned: Oral ETT  Additional Equipment:   Intra-op Plan:   Post-operative Plan: Extubation in OR  Informed Consent: I have reviewed the patients History and Physical, chart, labs and discussed the procedure including the risks, benefits and alternatives for the proposed anesthesia with the patient or authorized representative who has indicated his/her understanding and acceptance.     Plan Discussed with:   Anesthesia Plan Comments:         Anesthesia Quick Evaluation

## 2010-11-07 NOTE — Anesthesia Procedure Notes (Addendum)
Procedure Name: Intubation Date/Time: 11/07/2010 8:07 AM Performed by: Despina Hidden Pre-anesthesia Checklist: Patient being monitored, Patient identified, Emergency Drugs available, Timeout performed and Suction available Patient Re-evaluated:Patient Re-evaluated prior to inductionOxygen Delivery Method: Circle System Utilized Preoxygenation: Pre-oxygenation with 100% oxygen Intubation Type: IV induction, Rapid sequence and Circoid Pressure applied Ventilation: Mask ventilation without difficulty Laryngoscope Size: 3 and Mac Grade View: Grade I Tube type: Oral Tube size: 8.0 mm Airway Equipment and Method: stylet Placement Confirmation: ETT inserted through vocal cords under direct vision,  positive ETCO2 and breath sounds checked- equal and bilateral Secured at: 24 cm Tube secured with: Tape Dental Injury: Teeth and Oropharynx as per pre-operative assessment

## 2010-11-07 NOTE — Op Note (Signed)
Patient:  Shane Crawford  DOB:  1985-09-12  MRN:  [id number]   Preop Diagnosis:  Chronic cholecystitis   Postop Diagnosis:  The same  Procedure:  Laparoscopic cholecystectomy  Surgeon:  Dr. Tilford Pillar  Anes:  General endotracheal  Indications:  Patient is a 25 year old male well-known to me with a history of various abdominal pain complaints. He did undergo a recent HIDA scan which apparently exacerbated some of his symptoms during administration of CCK. Based on this we did discuss surgical options for cholecystectomy. Risks benefits alternatives were discussed at length including but not limited to risk of bleeding, infection, bile leak, small bowel injury, common bile duct injury, intraoperative cardiac and pulmonary events. Patient's questions and concerns are addressed the patient was consented for the planned procedure.  Procedure note:  Patient is taken to the OR was placed in a supine position on the OR table. General anesthetic was administered and was patient was asleep he was endotracheally intubated by anesthesia. At this point his abdomen is prepped with DuraPrep solution and draped in standard fashion. Stab incision was created the previous supraumbilical incisional scar. Additional dissection down to subcuticular tissues. Using a Coker clamp she was grasped and the abdominal fascia and lift this anteriorly. A Veress needle was inserted saline drop test is utilized firm intraperitoneal placement the pneumoperitoneum was obtained. Once sufficient pneumoperitoneum was obtained an 11 mm trocar was inserted over laparoscopic visualization the trocar entering into the peritoneal cavity. At this point the inner cannulas removed the laparoscope was reinserted there is no the same Veress or trocar placement injury. At this point the patient was placed into a reverse Trendelenburg left lateral decubitus position. The remaining trochars were placed with a 5 mm can epigastrium a 5 mm  trocar in the midline and a 5 mm trocar in the right lateral abdominal wall. With the trochars in place the fundus of the gallbladder was grasped and lifted up and over the right lobe liver. Blunt dissection was carried out using a [place] the stripped the peritoneal reflection off the infundibulum. This exposes both the cystic duct and cystic artery is entered into the infundibulum. A window was created behind both the structures. An Endo Clip was utilized to ligate both the structures with 3 endoclips placed proximally one distally and the cystic duct and 2 proximally one distally and the cystic artery. Both structures were divided using Endo Shears. At this point the gallbladder was dissected off the gallbladder fossa using electrocautery once free was placed into an Endo Catch bag. At this point a 10 mm scope was exchanged for a 5 mm laparoscope. Inspection of the gallbladder fossa demonstrated excellent hemostasis. The endoclips were in excellent position with no evidence of any bleeding or bile leak. At this point attention was turned to closure.  Using Endo Close suture passing device a 2-0 Vicryl suture was passed through the umbilical trocar site with this suture and placed the gallbladder was retrieved was removed through the umbilical trocar site and intact Endo Catch bag. The gallbladder is placed in the back table and sent as a permanent specimen to pathology. At this point the pneumoperitoneum was evacuated trochars removed the Vicryl suture was secured. Local anesthetic was instilled. The 4 trocar sites were reapproximated the skin edges using a 4-0 Monocryl in a running subcuticular suture. The skin was washed dried moist dry towel. Benzoin is applied around all 4 trocar sites. Half-inch are suture placed. The drapes removed the patient left come  out of general anesthetic and was transferred back to the PACU in table condition. At the conclusion of procedure all instrument, sponge, and  needle counts were correct. Patient tolerated procedure extremely well.  Complications:  None  EBL:  Scant  Specimen:  Gallbladder

## 2010-11-07 NOTE — H&P (View-Only) (Signed)
Shane Crawford is an 25 y.o. male.   Chief Complaint: hematochezia HPI: s/p laparoscopic appendectomy recently who now is experiencing left sided abdominal crampying with hematochezia.  Started last week.  Is variable.  No past medical history on file.  Past Surgical History  Procedure Date  . Laparoscopy 10/08/2010    Procedure: LAPAROSCOPY DIAGNOSTIC;  Surgeon: Shane Crawford;  Location: [facility];  Service: General;  Laterality: N/A;  . Laparoscopic appendectomy 10/08/2010    Procedure: APPENDECTOMY LAPAROSCOPIC;  Surgeon: Shane Crawford;  Location: [facility];  Service: General;  Laterality: N/A;    Family History  Problem Relation Age of Onset  . Anesthesia problems Neg Hx   . Hypotension Neg Hx   . Malignant hyperthermia Neg Hx   . Pseudochol deficiency Neg Hx    Social History:  reports that he has been smoking Cigarettes.  He has a 7 pack-year smoking history. He does not have any smokeless tobacco history on file. He reports that he drinks alcohol. He reports that he does not use illicit drugs.  Allergies: No Known Allergies  Medications Prior to Admission  Medication Dose Route Frequency Provider Last Rate Last Dose  . DISCONTD: 0.9 %  sodium chloride infusion   Intravenous Continuous Shane Crawford. Rubin Payor, MD       Medications Prior to Admission  Medication Sig Dispense Refill  . Bismuth Subsalicylate (PEPTO-BISMOL PO) Take 10 mLs by mouth once as needed. For stomach pain       . ibuprofen (ADVIL,MOTRIN) 200 MG tablet Take 600 mg by mouth every 6 (six) hours as needed. For headache      . ondansetron (ZOFRAN) 4 MG tablet Take 1 tablet (4 mg total) by mouth every 6 (six) hours.  12 tablet  0  . oxyCODONE-acetaminophen (PERCOCET) 5-325 MG per tablet Take one-two tabs q 4-6 hrs prn pain  45 tablet  0  . oxyCODONE-acetaminophen (PERCOCET) 5-325 MG per tablet Take 1 tablet by mouth every 4 (four) hours as needed for pain.  15 tablet  0    No results found for this or any  previous visit (from the past 48 hour(s)). No results found.  Review of Systems  Constitutional: Negative.   HENT: Negative.   Eyes: Negative.   Respiratory: Negative.   Cardiovascular: Negative.   Gastrointestinal: Positive for abdominal pain and blood in stool.  Genitourinary: Negative.   Musculoskeletal: Negative.   Skin: Negative.   Neurological: Negative.   Endo/Heme/Allergies: Negative.   Psychiatric/Behavioral: Negative.     There were no vitals taken for this visit. Physical Exam  Constitutional: He is oriented to person, place, and time. He appears well-developed and well-nourished.  HENT:  Head: Normocephalic and atraumatic.  Eyes: Pupils are equal, round, and reactive to light.  Neck: Normal range of motion. Neck supple.  Cardiovascular: Normal rate, regular rhythm and normal heart sounds.   Respiratory: Effort normal and breath sounds normal.  GI: Soft. Bowel sounds are normal. There is tenderness.       Left sided abdominal pain.  Neurological: He is alert and oriented to person, place, and time.  Skin: Skin is warm and dry.     Assessment/Plan Assessment:  Hematochezia Plan:  For TCS on 10/23/10.    Othel Hoogendoorn A 10/22/2010, 12:14 PM

## 2010-11-07 NOTE — Anesthesia Postprocedure Evaluation (Signed)
  Anesthesia Post-op Note  Patient: Shane Crawford  Procedure(s) Performed:  LAPAROSCOPIC CHOLECYSTECTOMY  Patient Location: PACU  Anesthesia Type: General  Level of Consciousness: awake, alert , oriented and patient cooperative  Airway and Oxygen Therapy: Patient Spontanous Breathing and Patient connected to face mask oxygen  Post-op Pain: 8 /10, severe  Post-op Assessment: Post-op Vital signs reviewed, Patient's Cardiovascular Status Stable, Respiratory Function Stable, Patent Airway and No signs of Nausea or vomiting  Post-op Vital Signs: Reviewed and stable  Complications: No apparent anesthesia complications

## 2010-11-07 NOTE — Transfer of Care (Signed)
Immediate Anesthesia Transfer of Care Note  Patient: Shane Crawford  Procedure(s) Performed:  LAPAROSCOPIC CHOLECYSTECTOMY  Patient Location: PACU  Anesthesia Type: General  Level of Consciousness: awake, alert , oriented and patient cooperative  Airway & Oxygen Therapy: Patient Spontanous Breathing and Patient connected to face mask oxygen  Post-op Assessment: Report given to PACU RN, Post -op Vital signs reviewed and stable, Patient moving all extremities and Patient moving all extremities X 4  Post vital signs: Reviewed and stable  Complications: No apparent anesthesia complications

## 2010-11-07 NOTE — Interval H&P Note (Signed)
History and Physical Interval Note:   11/07/2010   7:49 AM   Cari Caraway  has presented today for surgery, with the diagnosis of Cholecystitis chronic [575.11]  The various methods of treatment have been discussed with the patient and family. After consideration of risks, benefits and other options for treatment, the patient has consented to  Procedure(s): LAPAROSCOPIC CHOLECYSTECTOMY as a surgical intervention .  I have reviewed the patients' chart and labs.  Questions were answered to the patient's satisfaction.    No significant change from prior H&P other than the interval HIDA scan.  This demonstrated EF of 97% but did exacerbate some but not all of his symptoms.  Due to this we did discuss surgical options.  Patient does wish to proceed with surgical intervention.  Patient understands that his episodes of BRBPR as well as lower abd pain are not related to the gallbladder and should they occur, additional GI work-up will be required.   Fabio Bering  MD

## 2010-11-13 ENCOUNTER — Encounter (HOSPITAL_COMMUNITY): Payer: Self-pay | Admitting: General Surgery

## 2010-12-10 LAB — URINALYSIS, ROUTINE W REFLEX MICROSCOPIC
Nitrite: NEGATIVE
Specific Gravity, Urine: >1.03 — ABNORMAL HIGH
Urobilinogen, UA: 1

## 2010-12-10 LAB — DIFFERENTIAL
Basophils Absolute: 0
Eosinophils Relative: 1
Lymphocytes Relative: 19 — ABNORMAL LOW
Neutro Abs: 6.8
Neutrophils Relative %: 73 — ABNORMAL HIGH

## 2010-12-10 LAB — COMPREHENSIVE METABOLIC PANEL
AST: 24
BUN: 7
CO2: 26
Calcium: 9.1
Chloride: 108
Creatinine, Ser: 1.02
GFR calc non Af Amer: >60
Glucose, Bld: 104 — ABNORMAL HIGH
Total Bilirubin: 0.9

## 2010-12-10 LAB — CBC
HCT: 48.7
Hemoglobin: 16.7 — ABNORMAL HIGH
MCHC: 34.4
MCV: 88.2
RBC: 5.52
WBC: 9.3

## 2010-12-10 LAB — LIPASE, BLOOD: Lipase: 18

## 2011-03-19 ENCOUNTER — Emergency Department (HOSPITAL_COMMUNITY)
Admission: EM | Admit: 2011-03-19 | Discharge: 2011-03-19 | Disposition: A | Discharge status: Home / Self Care | Payer: Medicaid Other | Attending: Emergency Medicine | Admitting: Emergency Medicine

## 2011-03-19 ENCOUNTER — Encounter (HOSPITAL_COMMUNITY): Payer: Self-pay | Admitting: Emergency Medicine

## 2011-03-19 DIAGNOSIS — X58XXXA Exposure to other specified factors, initial encounter: Secondary | ICD-10-CM | POA: Insufficient documentation

## 2011-03-19 DIAGNOSIS — S39012A Strain of muscle, fascia and tendon of lower back, initial encounter: Secondary | ICD-10-CM

## 2011-03-19 DIAGNOSIS — S335XXA Sprain of ligaments of lumbar spine, initial encounter: Secondary | ICD-10-CM | POA: Insufficient documentation

## 2011-03-19 DIAGNOSIS — Z9889 Other specified postprocedural states: Secondary | ICD-10-CM | POA: Insufficient documentation

## 2011-03-19 MED ORDER — DEXAMETHASONE 6 MG PO TABS: ORAL_TABLET | ORAL | Status: AC

## 2011-03-19 MED ORDER — HYDROCODONE-ACETAMINOPHEN 5-325 MG PO TABS: 1.0000 | ORAL_TABLET | ORAL | Status: AC | PRN

## 2011-03-19 MED ORDER — METHOCARBAMOL 500 MG PO TABS: ORAL_TABLET | ORAL | Status: DC

## 2011-03-19 NOTE — ED Notes (Signed)
Pt c/o back pain x one month  

## 2011-03-19 NOTE — ED Provider Notes (Signed)
History     CSN: [id number]  Arrival date & time 03/19/11  1243   First MD Initiated Contact with Patient 03/19/11 1322      Chief Complaint  Patient presents with  . Back Pain    (Consider location/radiation/quality/duration/timing/severity/associated sxs/prior treatment) Patient is a 26 y.o. male presenting with back pain. The history is provided by the patient.  Back Pain  The current episode started more than 1 week ago. The problem occurs daily. The problem has been gradually worsening. The pain is associated with falling. The pain is present in the lumbar spine. The quality of the pain is described as aching. The pain is moderate. The symptoms are aggravated by certain positions. The pain is the same all the time. Associated symptoms include tingling. Pertinent negatives include no chest pain, no numbness, no abdominal pain, no bowel incontinence, no bladder incontinence, no dysuria and no paresthesias. He has tried NSAIDs for the symptoms. The treatment provided no relief. Risk factors include obesity.    History reviewed. No pertinent past medical history.  Past Surgical History  Procedure Date  . Laparoscopy 10/08/2010    Procedure: LAPAROSCOPY DIAGNOSTIC;  Surgeon: Fabio Bering;  Location: [facility];  Service: General;  Laterality: N/A;  . Laparoscopic appendectomy 10/08/2010    Procedure: APPENDECTOMY LAPAROSCOPIC;  Surgeon: Fabio Bering;  Location: [facility];  Service: General;  Laterality: N/A;  . Colonoscopy 10/23/2010    Procedure: COLONOSCOPY;  Surgeon: Dalia Heading;  Location: [facility];  Service: Gastroenterology;  Laterality: N/A;  . Appendectomy 09/2010  . Cholecystectomy 11/07/2010    Procedure: LAPAROSCOPIC CHOLECYSTECTOMY;  Surgeon: Fabio Bering;  Location: [facility];  Service: General;  Laterality: N/A;    Family History  Problem Relation Age of Onset  . Anesthesia problems Neg Hx   . Hypotension Neg Hx   . Malignant hyperthermia Neg Hx   . Pseudochol  deficiency Neg Hx     History  Substance Use Topics  . Smoking status: Current Some Day Smoker -- 1.0 packs/day for 7 years    Types: Cigarettes    Last Attempt to Quit: 10/27/2010  . Smokeless tobacco: Not on file  . Alcohol Use: No     occassional- 1 beer q 3 weeks      Review of Systems  Constitutional: Negative for activity change.       All ROS Neg except as noted in HPI  HENT: Negative for nosebleeds and neck pain.   Eyes: Negative for photophobia and discharge.  Respiratory: Negative for cough, shortness of breath and wheezing.   Cardiovascular: Negative for chest pain and palpitations.  Gastrointestinal: Negative for abdominal pain, blood in stool and bowel incontinence.  Genitourinary: Negative for bladder incontinence, dysuria, frequency and hematuria.  Musculoskeletal: Positive for back pain. Negative for arthralgias.  Skin: Negative.   Neurological: Positive for tingling. Negative for dizziness, seizures, speech difficulty, numbness and paresthesias.  Psychiatric/Behavioral: Negative for hallucinations and confusion.    Allergies  Review of patient's allergies indicates no known allergies.  Home Medications   Current Outpatient Rx  Name Route Sig Dispense Refill  . PEPTO-BISMOL PO Oral Take 10 mLs by mouth once as needed. For stomach pain     . DEXAMETHASONE 6 MG PO TABS  1 tablet twice a day with food 12 tablet 0  . HYDROCODONE-ACETAMINOPHEN 5-325 MG PO TABS Oral Take 1 tablet by mouth every 4 (four) hours as needed for pain. 15 tablet 0  . IBUPROFEN 200  MG PO TABS Oral Take 600 mg by mouth every 6 (six) hours as needed. For headache    . METHOCARBAMOL 500 MG PO TABS  2 tablets by mouth 3 times a day for spasm 30 tablet 0  . OXYCODONE-ACETAMINOPHEN 5-325 MG PO TABS  Take one-two tabs q 4-6 hrs prn pain 45 tablet 0    BP 135/88  Pulse 73  Temp(Src) 97.6 F (36.4 C) (Oral)  Resp 18  Ht 6' (1.829 m)  Wt 288 lb (130.636 kg)  BMI 39.06 kg/m2  SpO2  97%  Physical Exam  Nursing note and vitals reviewed. Constitutional: He is oriented to person, place, and time. He appears well-developed and well-nourished.  Non-toxic appearance.  HENT:  Head: Normocephalic.  Right Ear: Tympanic membrane and external ear normal.  Left Ear: Tympanic membrane and external ear normal.  Eyes: EOM and lids are normal. Pupils are equal, round, and reactive to light.  Neck: Normal range of motion. Neck supple. Carotid bruit is not present.  Cardiovascular: Normal rate, regular rhythm, normal heart sounds, intact distal pulses and normal pulses.   Pulmonary/Chest: Breath sounds normal. No respiratory distress.  Abdominal: Soft. Bowel sounds are normal. There is no tenderness. There is no guarding.  Musculoskeletal:       Lumbar back: He exhibits decreased range of motion, tenderness and pain.       Spasm palpated with ROM exercises of the lower back.  Lymphadenopathy:       Head (right side): No submandibular adenopathy present.       Head (left side): No submandibular adenopathy present.    He has no cervical adenopathy.  Neurological: He is alert and oriented to person, place, and time. He has normal strength. No cranial nerve deficit or sensory deficit. He exhibits normal muscle tone. Coordination normal.  Skin: Skin is warm and dry.  Psychiatric: He has a normal mood and affect. His speech is normal.    ED Course  Procedures (including critical care time)  Labs Reviewed - No data to display No results found.   1. Lumbar strain       MDM  I have reviewed nursing notes, vital signs, and all appropriate lab and imaging results for this patient.   Rx for Decadron, Norco, and Robaxin given to the patient. Pt to rest back as much as possible.     Kathie Dike, [place] 03/19/11 1458

## 2011-03-19 NOTE — ED Notes (Signed)
Hobson PA in prior to RN, see PA assessment for further  

## 2011-03-20 NOTE — ED Provider Notes (Signed)
Medical screening examination/treatment/procedure(s) were performed by non-physician practitioner and as supervising physician I was immediately available for consultation/collaboration.    Patrece Tallie L Dane Bloch, MD 03/20/11 0708 

## 2011-05-11 ENCOUNTER — Emergency Department (HOSPITAL_COMMUNITY)
Admission: EM | Admit: 2011-05-11 | Discharge: 2011-05-11 | Disposition: A | Discharge status: Home / Self Care | Payer: Medicaid Other | Attending: Emergency Medicine | Admitting: Emergency Medicine

## 2011-05-11 ENCOUNTER — Encounter (HOSPITAL_COMMUNITY): Payer: Self-pay | Admitting: *Deleted

## 2011-05-11 DIAGNOSIS — S61411A Laceration without foreign body of right hand, initial encounter: Secondary | ICD-10-CM

## 2011-05-11 DIAGNOSIS — Y9289 Other specified places as the place of occurrence of the external cause: Secondary | ICD-10-CM | POA: Insufficient documentation

## 2011-05-11 DIAGNOSIS — Y990 Civilian activity done for income or pay: Secondary | ICD-10-CM | POA: Insufficient documentation

## 2011-05-11 DIAGNOSIS — F17200 Nicotine dependence, unspecified, uncomplicated: Secondary | ICD-10-CM | POA: Insufficient documentation

## 2011-05-11 DIAGNOSIS — R200 Anesthesia of skin: Secondary | ICD-10-CM

## 2011-05-11 DIAGNOSIS — S61209A Unspecified open wound of unspecified finger without damage to nail, initial encounter: Secondary | ICD-10-CM | POA: Insufficient documentation

## 2011-05-11 DIAGNOSIS — W01119A Fall on same level from slipping, tripping and stumbling with subsequent striking against unspecified sharp object, initial encounter: Secondary | ICD-10-CM | POA: Insufficient documentation

## 2011-05-11 DIAGNOSIS — W268XXA Contact with other sharp object(s), not elsewhere classified, initial encounter: Secondary | ICD-10-CM | POA: Insufficient documentation

## 2011-05-11 DIAGNOSIS — R209 Unspecified disturbances of skin sensation: Secondary | ICD-10-CM | POA: Insufficient documentation

## 2011-05-11 MED ORDER — HYDROCODONE-ACETAMINOPHEN 5-325 MG PO TABS: ORAL_TABLET | ORAL | Status: DC

## 2011-05-11 MED ORDER — IBUPROFEN 800 MG PO TABS
800.0000 mg | ORAL_TABLET | Freq: Once | ORAL | Status: AC
  Administered 2011-05-11: 800 mg via ORAL
  Filled 2011-05-11: qty 1

## 2011-05-11 MED ORDER — HYDROCODONE-ACETAMINOPHEN 5-325 MG PO TABS
1.0000 | ORAL_TABLET | Freq: Once | ORAL | Status: AC
  Administered 2011-05-11: 1 via ORAL
  Filled 2011-05-11: qty 1

## 2011-05-11 NOTE — ED Provider Notes (Signed)
History     CSN: [id number]  Arrival date & time 05/11/11  1125   First MD Initiated Contact with Patient 05/11/11 1223      Chief Complaint  Patient presents with  . Hand Pain    (Consider location/radiation/quality/duration/timing/severity/associated sxs/prior treatment) HPI Comments: Pt was injured on job ~ 2 weeks ago.  He fell and his R hand landed on top of chainsaw (not running).  He had 2 laceration repairs and a regimen of abx which has been completed.  He continues having R hand pain especially with attempts to move thumb and fingers,  His job is telling him that they are going to refer him to a hand specialist.  States it hurts all the time and his employer is doing nothing.  Patient is a 26 y.o. male presenting with hand pain. The history is provided by the patient.  Hand Pain Episode onset: ~ 2 weeks ago. The problem occurs constantly. The problem has been unchanged. Pertinent negatives include no fever. Exacerbated by: attempted hand movement and palpation. He has tried nothing for the symptoms.    History reviewed. No pertinent past medical history.  Past Surgical History  Procedure Date  . Laparoscopy 10/08/2010    Procedure: LAPAROSCOPY DIAGNOSTIC;  Surgeon: Fabio Bering;  Location: [facility];  Service: General;  Laterality: N/A;  . Laparoscopic appendectomy 10/08/2010    Procedure: APPENDECTOMY LAPAROSCOPIC;  Surgeon: Fabio Bering;  Location: [facility];  Service: General;  Laterality: N/A;  . Colonoscopy 10/23/2010    Procedure: COLONOSCOPY;  Surgeon: Dalia Heading;  Location: [facility];  Service: Gastroenterology;  Laterality: N/A;  . Appendectomy 09/2010  . Cholecystectomy 11/07/2010    Procedure: LAPAROSCOPIC CHOLECYSTECTOMY;  Surgeon: Fabio Bering;  Location: [facility];  Service: General;  Laterality: N/A;    Family History  Problem Relation Age of Onset  . Anesthesia problems Neg Hx   . Hypotension Neg Hx   . Malignant hyperthermia Neg Hx   .  Pseudochol deficiency Neg Hx     History  Substance Use Topics  . Smoking status: Current Everyday Smoker -- 1.0 packs/day for 7 years    Types: Cigarettes    Last Attempt to Quit: 10/27/2010  . Smokeless tobacco: Not on file  . Alcohol Use: Yes     occassional- 1 beer q 3 weeks      Review of Systems  Constitutional: Negative for fever.  Musculoskeletal:       Hand injury  All other systems reviewed and are negative.    Allergies  Review of patient's allergies indicates no known allergies.  Home Medications   Current Outpatient Rx  Name Route Sig Dispense Refill  . PEPTO-BISMOL PO Oral Take 10 mLs by mouth once as needed. For stomach pain     . IBUPROFEN 200 MG PO TABS Oral Take 600 mg by mouth every 6 (six) hours as needed. For headache    . NAPROXEN SODIUM 220 MG PO TABS Oral Take 220 mg by mouth daily as needed. For pain    . HYDROCODONE-ACETAMINOPHEN 5-325 MG PO TABS  One tab po q 4-6 hrs prn pain 20 tablet 0    BP 139/75  Pulse 105  Temp(Src) 98.1 F (36.7 C) (Oral)  Resp 20  Ht 6' (1.829 m)  Wt 292 lb (132.45 kg)  BMI 39.60 kg/m2  SpO2 100%  Physical Exam  Nursing note and vitals reviewed. Constitutional: He is oriented to person, place, and time. He appears  well-developed and well-nourished. No distress.  HENT:  Head: Normocephalic and atraumatic.  Eyes: EOM are normal.  Neck: Normal range of motion.  Cardiovascular: Normal rate, regular rhythm, normal heart sounds and intact distal pulses.   Pulmonary/Chest: Effort normal and breath sounds normal. No respiratory distress.  Abdominal: Soft. He exhibits no distension. There is no tenderness.  Musculoskeletal: He exhibits tenderness.       Right hand: He exhibits decreased range of motion, tenderness and laceration. He exhibits no bony tenderness, normal capillary refill and no deformity. decreased sensation noted. Decreased strength noted. He exhibits finger abduction and thumb/finger opposition.        Hands:      "numbness" in all three nerve distributions of hand.  Able to discern touch "but it feels different"  Neurological: He is alert and oriented to person, place, and time.  Skin: Skin is warm and dry.  Psychiatric: He has a normal mood and affect. Judgment normal.    ED Course  Procedures (including critical care time)  Labs Reviewed - No data to display No results found.   1. Laceration of right hand with complication   2. Numbness and tingling in right hand       MDM  rx hydrocodone OTC ibuprofen Referral to dr. Milagros Reap, PA 05/11/11 1512  Worthy Rancher, [place] 05/11/11 1517

## 2011-05-11 NOTE — Discharge Instructions (Signed)
Take the meds as directed.Marland Kitchen  ake ibuprofen 800 mg every 8 hrs with food.  Follow up with dr. Amanda Pea or the hand specialist your employer recommends.

## 2011-05-11 NOTE — ED Notes (Signed)
Injured hand while at work on the 4th. Had sutures removed Wednesday. Waiting for workman's comp to get pt in to see hand specialist. Pt states unable to tolerate pain to the hand

## 2011-05-11 NOTE — ED Provider Notes (Signed)
Medical screening examination/treatment/procedure(s) were performed by non-physician practitioner and as supervising physician I was immediately available for consultation/collaboration.   Mariateresa Batra L Lashawn Bromwell, MD 05/11/11 1617 

## 2011-07-23 ENCOUNTER — Emergency Department (HOSPITAL_COMMUNITY)
Admission: EM | Admit: 2011-07-23 | Discharge: 2011-07-23 | Disposition: A | Discharge status: Home / Self Care | Payer: Medicaid Other | Attending: Emergency Medicine | Admitting: Emergency Medicine

## 2011-07-23 ENCOUNTER — Encounter (HOSPITAL_COMMUNITY): Payer: Self-pay | Admitting: *Deleted

## 2011-07-23 DIAGNOSIS — Z79899 Other long term (current) drug therapy: Secondary | ICD-10-CM | POA: Insufficient documentation

## 2011-07-23 DIAGNOSIS — J019 Acute sinusitis, unspecified: Secondary | ICD-10-CM | POA: Insufficient documentation

## 2011-07-23 DIAGNOSIS — J40 Bronchitis, not specified as acute or chronic: Secondary | ICD-10-CM | POA: Insufficient documentation

## 2011-07-23 DIAGNOSIS — J329 Chronic sinusitis, unspecified: Secondary | ICD-10-CM | POA: Insufficient documentation

## 2011-07-23 DIAGNOSIS — Z9089 Acquired absence of other organs: Secondary | ICD-10-CM | POA: Insufficient documentation

## 2011-07-23 DIAGNOSIS — F17200 Nicotine dependence, unspecified, uncomplicated: Secondary | ICD-10-CM | POA: Insufficient documentation

## 2011-07-23 MED ORDER — ALBUTEROL SULFATE HFA 108 (90 BASE) MCG/ACT IN AERS
2.0000 | INHALATION_SPRAY | RESPIRATORY_TRACT | Status: DC
  Filled 2011-07-23: qty 6.7

## 2011-07-23 MED ORDER — AMOXICILLIN 500 MG PO CAPS: ORAL_CAPSULE | ORAL | Status: DC

## 2011-07-23 MED ORDER — PREDNISONE 10 MG PO TABS: ORAL_TABLET | ORAL | Status: DC

## 2011-07-23 MED ORDER — PENICILLIN V POTASSIUM 250 MG PO TABS
500.0000 mg | ORAL_TABLET | Freq: Once | ORAL | Status: AC
  Administered 2011-07-23: 500 mg via ORAL
  Filled 2011-07-23: qty 2

## 2011-07-23 MED ORDER — FEXOFENADINE-PSEUDOEPHED ER 60-120 MG PO TB12: 1.0000 | ORAL_TABLET | Freq: Two times a day (BID) | ORAL | Status: DC

## 2011-07-23 NOTE — Discharge Instructions (Signed)
Please increase fluids. Please wash hands frequently. Amoxicillin 2 times daily with food. For prednisone daily with food. Allegra-D for congestion every 12 hours. Please use albuterol every 4 hours for wheezing and to improve breathing.Bronchitis Bronchitis is a problem of the air tubes leading to your lungs. This problem makes it hard for air to get in and out of the lungs. You may cough a lot because your air tubes are narrow. Going without care can cause lasting (chronic) bronchitis. HOME CARE   Drink enough fluids to keep your pee (urine) clear or pale yellow.   Use a cool mist humidifier.   Quit smoking if you smoke. If you keep smoking, the bronchitis might not get better.   Only take medicine as told by your doctor.  GET HELP RIGHT AWAY IF:   Coughing keeps you awake.   You start to wheeze.   You become more sick or weak.   You have a hard time breathing or get short of breath.   You cough up blood.   Coughing lasts more than 2 weeks.   You have a fever.   Your baby is older than 3 months with a rectal temperature of 102 F (38.9 C) or higher.   Your baby is 14 months old or younger with a rectal temperature of 100.4 F (38 C) or higher.  MAKE SURE YOU:  Understand these instructions.   Will watch your condition.   Will get help right away if you are not doing well or get worse.  Document Released: 07/31/2007 Document Revised: 01/31/2011 Document Reviewed: 01/13/2009 [facility] Patient Information 2012 [place], [place].Sinusitis Sinuses are air pockets within the bones of your face. The growth of bacteria within a sinus leads to infection. The infection prevents the sinuses from draining. This infection is called sinusitis. SYMPTOMS  There will be different areas of pain depending on which sinuses have become infected.  The maxillary sinuses often produce pain beneath the eyes.   Frontal sinusitis may cause pain in the middle of the forehead and above the eyes.    Other problems (symptoms) include:  Toothaches.   Colored, pus-like (purulent) drainage from the nose.   Swelling, warmth, and tenderness over the sinus areas may be signs of infection.  TREATMENT  Sinusitis is most often determined by an exam.X-rays may be taken. If x-rays have been taken, make sure you obtain your results or find out how you are to obtain them. Your caregiver may give you medications (antibiotics). These are medications that will help kill the bacteria causing the infection. You may also be given a medication (decongestant) that helps to reduce sinus swelling.  HOME CARE INSTRUCTIONS   Only take over-the-counter or prescription medicines for pain, discomfort, or fever as directed by your caregiver.   Drink extra fluids. Fluids help thin the mucus so your sinuses can drain more easily.   Applying either moist heat or ice packs to the sinus areas may help relieve discomfort.   Use saline nasal sprays to help moisten your sinuses. The sprays can be found at your local drugstore.  SEEK IMMEDIATE MEDICAL CARE IF:  You have a fever.   You have increasing pain, severe headaches, or toothache.   You have nausea, vomiting, or drowsiness.   You develop unusual swelling around the face or trouble seeing.  MAKE SURE YOU:   Understand these instructions.   Will watch your condition.   Will get help right away if you are not doing well or get  worse.  Document Released: 02/11/2005 Document Revised: 01/31/2011 Document Reviewed: 09/10/2006 [facility] Patient Information 2012 [place], [place].

## 2011-07-23 NOTE — ED Provider Notes (Signed)
History     CSN: [id number]  Arrival date & time 07/23/11  1056   First MD Initiated Contact with Patient 07/23/11 1135      Chief Complaint  Patient presents with  . Cough    (Consider location/radiation/quality/duration/timing/severity/associated sxs/prior treatment) Patient is a 26 y.o. male presenting with cough. The history is provided by the patient.  Cough This is a new problem. The current episode started 2 days ago. The problem occurs constantly. The problem has been gradually worsening. The cough is productive of sputum. There has been no fever. Associated symptoms include headaches and wheezing. Pertinent negatives include no chest pain and no shortness of breath. He has tried nothing for the symptoms. He is a smoker. His past medical history is significant for bronchitis. His past medical history does not include asthma.    History reviewed. No pertinent past medical history.  Past Surgical History  Procedure Date  . Laparoscopy 10/08/2010    Procedure: LAPAROSCOPY DIAGNOSTIC;  Surgeon: Fabio Bering;  Location: [facility];  Service: General;  Laterality: N/A;  . Laparoscopic appendectomy 10/08/2010    Procedure: APPENDECTOMY LAPAROSCOPIC;  Surgeon: Fabio Bering;  Location: [facility];  Service: General;  Laterality: N/A;  . Colonoscopy 10/23/2010    Procedure: COLONOSCOPY;  Surgeon: Dalia Heading;  Location: [facility];  Service: Gastroenterology;  Laterality: N/A;  . Appendectomy 09/2010  . Cholecystectomy 11/07/2010    Procedure: LAPAROSCOPIC CHOLECYSTECTOMY;  Surgeon: Fabio Bering;  Location: [facility];  Service: General;  Laterality: N/A;    Family History  Problem Relation Age of Onset  . Anesthesia problems Neg Hx   . Hypotension Neg Hx   . Malignant hyperthermia Neg Hx   . Pseudochol deficiency Neg Hx   . Cancer Mother   . Diabetes Mother   . Heart failure Mother   . Cancer Father   . Diabetes Father     History  Substance Use Topics  . Smoking  status: Current Everyday Smoker -- 1.0 packs/day for 7 years    Types: Cigarettes    Last Attempt to Quit: 10/27/2010  . Smokeless tobacco: Not on file  . Alcohol Use: Yes     occassional- 1 beer q 3 weeks      Review of Systems  Constitutional: Negative for activity change.       All ROS Neg except as noted in HPI  HENT: Positive for postnasal drip. Negative for nosebleeds and neck pain.   Eyes: Negative for photophobia and discharge.  Respiratory: Positive for cough and wheezing. Negative for shortness of breath.   Cardiovascular: Negative for chest pain and palpitations.  Gastrointestinal: Negative for abdominal pain and blood in stool.  Genitourinary: Negative for dysuria, frequency and hematuria.  Musculoskeletal: Negative for back pain and arthralgias.  Skin: Negative.   Neurological: Positive for dizziness and headaches. Negative for seizures and speech difficulty.  Psychiatric/Behavioral: Negative for hallucinations and confusion.    Allergies  Review of patient's allergies indicates no known allergies.  Home Medications   Current Outpatient Rx  Name Route Sig Dispense Refill  . PREGABALIN 75 MG PO CAPS Oral Take 150 mg by mouth at bedtime.    . TRAMADOL HCL 50 MG PO TABS Oral Take 50 mg by mouth every 6 (six) hours as needed. For pain    . AMOXICILLIN 500 MG PO CAPS  2 po bid with food 28 capsule 0  . FEXOFENADINE-PSEUDOEPHED ER 60-120 MG PO TB12 Oral Take 1 tablet by  mouth every 12 (twelve) hours. 20 tablet 0    Take for congestion  . PREDNISONE 10 MG PO TABS  6,5,4,3,2,1 - take with food 21 tablet 0    BP 143/83  Pulse 87  Temp(Src) 98.2 F (36.8 C) (Oral)  Resp 18  Ht 6' (1.829 m)  Wt 284 lb (128.822 kg)  BMI 38.52 kg/m2  SpO2 99%  Physical Exam  Nursing note and vitals reviewed. Constitutional: He is oriented to person, place, and time. He appears well-developed and well-nourished.  Non-toxic appearance.  HENT:  Head: Normocephalic.  Right Ear:  Tympanic membrane and external ear normal.  Left Ear: Tympanic membrane and external ear normal.       Nasal congestion present.  Eyes: EOM and lids are normal. Pupils are equal, round, and reactive to light.  Neck: Normal range of motion. Neck supple. Carotid bruit is not present.  Cardiovascular: Normal rate, regular rhythm, normal heart sounds, intact distal pulses and normal pulses.   Pulmonary/Chest: No respiratory distress. He has wheezes.  Abdominal: Soft. Bowel sounds are normal. There is no tenderness. There is no guarding.  Musculoskeletal: Normal range of motion.  Lymphadenopathy:       Head (right side): No submandibular adenopathy present.       Head (left side): No submandibular adenopathy present.    He has no cervical adenopathy.  Neurological: He is alert and oriented to person, place, and time. He has normal strength. No cranial nerve deficit or sensory deficit.  Skin: Skin is warm and dry.  Psychiatric: He has a normal mood and affect. His speech is normal.    ED Course  Procedures (including critical care time)  Labs Reviewed - No data to display No results found.   1. Bronchitis   2. Sinusitis       MDM  I have reviewed nursing notes, vital signs, and all appropriate lab and imaging results for this patient. Pulse ox 99% on room air. Pt speaking in complete sentences. Rx for Amoxil, Allegra D and Prednisone given to the patient.       Kathie Dike, [place] 08/02/11 1524

## 2011-07-23 NOTE — ED Notes (Signed)
Cough, nasal congestion.  Feels sob,green sputum.  No fever known.sl sore throat

## 2011-08-02 NOTE — ED Provider Notes (Signed)
Medical screening examination/treatment/procedure(s) were performed by non-physician practitioner and as supervising physician I was immediately available for consultation/collaboration.   Glynn Octave, MD 08/02/11 1531

## 2011-10-14 ENCOUNTER — Emergency Department (HOSPITAL_COMMUNITY)
Admission: EM | Admit: 2011-10-14 | Discharge: 2011-10-14 | Disposition: A | Discharge status: Home / Self Care | Payer: Medicaid Other | Attending: Emergency Medicine | Admitting: Emergency Medicine

## 2011-10-14 ENCOUNTER — Encounter (HOSPITAL_COMMUNITY): Payer: Self-pay | Admitting: *Deleted

## 2011-10-14 ENCOUNTER — Emergency Department (HOSPITAL_COMMUNITY): Payer: Medicaid Other

## 2011-10-14 DIAGNOSIS — Z833 Family history of diabetes mellitus: Secondary | ICD-10-CM | POA: Insufficient documentation

## 2011-10-14 DIAGNOSIS — Z809 Family history of malignant neoplasm, unspecified: Secondary | ICD-10-CM | POA: Insufficient documentation

## 2011-10-14 DIAGNOSIS — Z8489 Family history of other specified conditions: Secondary | ICD-10-CM | POA: Insufficient documentation

## 2011-10-14 DIAGNOSIS — Z8249 Family history of ischemic heart disease and other diseases of the circulatory system: Secondary | ICD-10-CM | POA: Insufficient documentation

## 2011-10-14 DIAGNOSIS — M25519 Pain in unspecified shoulder: Secondary | ICD-10-CM | POA: Insufficient documentation

## 2011-10-14 DIAGNOSIS — F17200 Nicotine dependence, unspecified, uncomplicated: Secondary | ICD-10-CM | POA: Insufficient documentation

## 2011-10-14 DIAGNOSIS — M25511 Pain in right shoulder: Secondary | ICD-10-CM

## 2011-10-14 MED ORDER — NAPROXEN 500 MG PO TABS: 500.0000 mg | ORAL_TABLET | Freq: Two times a day (BID) | ORAL | Status: DC

## 2011-10-14 MED ORDER — HYDROCODONE-ACETAMINOPHEN 5-325 MG PO TABS: ORAL_TABLET | ORAL | Status: AC

## 2011-10-14 NOTE — ED Notes (Signed)
Rt shoulder pain for 2 weeks, Hears a "grinding noise" with movement.  No known injury.

## 2011-10-14 NOTE — ED Provider Notes (Signed)
History     CSN: [id number]  Arrival date & time 10/14/11  1413   First MD Initiated Contact with Patient 10/14/11 1605      Chief Complaint  Patient presents with  . Shoulder Pain    (Consider location/radiation/quality/duration/timing/severity/associated sxs/prior treatment) HPI Comments: Patient c/o pain and "grinding noise" to his right [place]oulder for 2 weeks.  States the pain is worse with rotation of his arm and improves with rest.  States that he does al lot of lifting but denies known injury.  He also denies numbness or weakness of the arm, neck pain, fever, discoloration or swelling.    Patient is a 26 y.o. male presenting with shoulder pain. The history is provided by the patient.  Shoulder Pain This is a chronic problem. The current episode started 1 to 4 weeks ago. The problem occurs constantly. The problem has been unchanged. Associated symptoms include arthralgias. Pertinent negatives include no chills, congestion, fever, headaches, joint swelling, neck pain, numbness, rash, swollen glands, vomiting or weakness. Exacerbated by: rotation and movement. He has tried acetaminophen for the symptoms. The treatment provided no relief.    History reviewed. No pertinent past medical history.  Past Surgical History  Procedure Date  . Laparoscopy 10/08/2010    Procedure: LAPAROSCOPY DIAGNOSTIC;  Surgeon: Fabio Bering;  Location: [facility];  Service: General;  Laterality: N/A;  . Laparoscopic appendectomy 10/08/2010    Procedure: APPENDECTOMY LAPAROSCOPIC;  Surgeon: Fabio Bering;  Location: [facility];  Service: General;  Laterality: N/A;  . Colonoscopy 10/23/2010    Procedure: COLONOSCOPY;  Surgeon: Dalia Heading;  [facility];  Service: Gastroenterology;  Laterality: N/A;  . Appendectomy 09/2010  . Cholecystectomy 11/07/2010    Procedure: LAPAROSCOPIC CHOLECYSTECTOMY;  Surgeon: Fabio Bering;  Location: [facility];  Service: General;  Laterality: N/A;    Family History    Problem Relation Age of Onset  . Anesthesia problems Neg Hx   . Hypotension Neg Hx   . Malignant hyperthermia Neg Hx   . Pseudochol deficiency Neg Hx   . Cancer Mother   . Diabetes Mother   . Heart failure Mother   . Cancer Father   . Diabetes Father     History  Substance Use Topics  . Smoking status: Current Everyday Smoker -- 1.0 packs/day for 7 years    Types: Cigarettes    Last Attempt to Quit: 10/27/2010  . Smokeless tobacco: Not on file  . Alcohol Use: Yes     occassional- 1 beer q 3 weeks      Review of Systems  Constitutional: Negative for fever and chills.  HENT: Negative for congestion and neck pain.   Gastrointestinal: Negative for vomiting.  Genitourinary: Negative for dysuria and difficulty urinating.  Musculoskeletal: Positive for arthralgias. Negative for back pain, joint swelling and gait problem.  Skin: Negative for color change, rash and wound.  Neurological: Negative for weakness, numbness and headaches.  All other systems reviewed and are negative.    Allergies  Review of patient's allergies indicates no known allergies.  Home Medications   Current Outpatient Rx  Name Route Sig Dispense Refill  . AMOXICILLIN 500 MG PO CAPS  2 po bid with food 28 capsule 0  . FEXOFENADINE-PSEUDOEPHED ER 60-120 MG PO TB12 Oral Take 1 tablet by mouth every 12 (twelve) hours. 20 tablet 0    Take for congestion  . PREDNISONE 10 MG PO TABS  6,5,4,3,2,1 - take with food 21 tablet 0  .  PREGABALIN 75 MG PO CAPS Oral Take 150 mg by mouth at bedtime.    . TRAMADOL HCL 50 MG PO TABS Oral Take 50 mg by mouth every 6 (six) hours as needed. For pain      BP 139/76  Pulse 81  Temp 97.9 F (36.6 C) (Oral)  Resp 20  Ht 6' (1.829 m)  Wt 285 lb (129.275 kg)  BMI 38.65 kg/m2  SpO2 100%  Physical Exam  Nursing note and vitals reviewed. Constitutional: He is oriented to person, place, and time. He appears well-developed and well-nourished. No distress.  HENT:  Head:  Normocephalic and atraumatic.  Neck: Normal range of motion. Neck supple.  Cardiovascular: Normal rate, regular rhythm, normal heart sounds and intact distal pulses.   No murmur heard. Pulmonary/Chest: Effort normal and breath sounds normal. He exhibits no tenderness.  Musculoskeletal: He exhibits tenderness. He exhibits no edema.       Right shoulder: He exhibits tenderness, bony tenderness, crepitus and pain. He exhibits normal range of motion, no swelling, no effusion, no deformity, no laceration, no spasm, normal pulse and normal strength.       Arms:      Pain to right shoulder is reproduced with abduction and rotation of the shoulder joint.  Radial pulse brisk, sensation intact, CR< 3 sec  Lymphadenopathy:    He has no cervical adenopathy.  Neurological: He is alert and oriented to person, place, and time. He exhibits normal muscle tone. Coordination normal.  Skin: Skin is warm and dry.    ED Course  Procedures (including critical care time)  Labs Reviewed - No data to display Dg Shoulder Right  10/14/2011  *RADIOLOGY REPORT*  Clinical Data: Right shoulder pain.  RIGHT SHOULDER - 2+ VIEW  Comparison: None.  Findings: No acute bony abnormality.  Specifically, no fracture, subluxation, or dislocation.  Soft tissues are intact.  IMPRESSION: No acute bony abnormality.   Original Report Authenticated By: Cyndie Chime, M.D.     Sling applied, pain improved, remains NV intact.    MDM   ttp of the anterolateral right shoulder joint.  Pain also reproduced with rotation and abduction of the right arm.  Possible rotator cuff injury.  Pt advised of x-ray findings and agrees to close f/u with Dr. Romeo Apple   The patient appears reasonably screened and/or stabilized for discharge and I doubt any other medical condition or other [facility] requiring further screening, evaluation, or treatment in the ED at this time prior to discharge.   Prescribed:  naprosyn Norco #20   Rochelle Nephew L. Jackson,  [place] 10/18/11 2001

## 2011-10-18 ENCOUNTER — Other Ambulatory Visit (HOSPITAL_COMMUNITY): Payer: Self-pay | Admitting: Orthopaedic Surgery

## 2011-10-18 DIAGNOSIS — M25519 Pain in unspecified shoulder: Secondary | ICD-10-CM

## 2011-10-21 NOTE — ED Provider Notes (Signed)
Medical screening examination/treatment/procedure(s) were performed by non-physician practitioner and as supervising physician I was immediately available for consultation/collaboration.  Donnetta Hutching, MD 10/21/11 [phone]

## 2011-10-22 ENCOUNTER — Ambulatory Visit (HOSPITAL_COMMUNITY)
Admission: RE | Admit: 2011-10-22 | Discharge: 2011-10-22 | Disposition: A | Discharge status: Home / Self Care | Payer: Medicaid Other | Source: Ambulatory Visit | Attending: Orthopaedic Surgery | Admitting: Orthopaedic Surgery

## 2011-10-22 DIAGNOSIS — M75100 Unspecified rotator cuff tear or rupture of unspecified shoulder, not specified as traumatic: Secondary | ICD-10-CM | POA: Insufficient documentation

## 2011-10-22 DIAGNOSIS — M25519 Pain in unspecified shoulder: Secondary | ICD-10-CM | POA: Insufficient documentation

## 2011-10-22 DIAGNOSIS — IMO0002 Reserved for concepts with insufficient information to code with codable children: Secondary | ICD-10-CM | POA: Insufficient documentation

## 2012-03-30 ENCOUNTER — Ambulatory Visit: Payer: Self-pay

## 2012-03-30 ENCOUNTER — Other Ambulatory Visit: Payer: Self-pay | Admitting: Occupational Medicine

## 2012-03-30 DIAGNOSIS — Z0000 Encounter for general adult medical examination without abnormal findings: Secondary | ICD-10-CM

## 2012-07-20 ENCOUNTER — Encounter (HOSPITAL_COMMUNITY): Payer: Self-pay

## 2012-07-20 ENCOUNTER — Emergency Department (HOSPITAL_COMMUNITY): Payer: BC Managed Care – PPO

## 2012-07-20 ENCOUNTER — Emergency Department (HOSPITAL_COMMUNITY)
Admission: EM | Admit: 2012-07-20 | Discharge: 2012-07-21 | Disposition: A | Discharge status: Home / Self Care | Payer: BC Managed Care – PPO | Attending: Emergency Medicine | Admitting: Emergency Medicine

## 2012-07-20 DIAGNOSIS — G43909 Migraine, unspecified, not intractable, without status migrainosus: Secondary | ICD-10-CM | POA: Insufficient documentation

## 2012-07-20 DIAGNOSIS — Z79899 Other long term (current) drug therapy: Secondary | ICD-10-CM | POA: Insufficient documentation

## 2012-07-20 DIAGNOSIS — F17200 Nicotine dependence, unspecified, uncomplicated: Secondary | ICD-10-CM | POA: Insufficient documentation

## 2012-07-20 DIAGNOSIS — R0602 Shortness of breath: Secondary | ICD-10-CM | POA: Insufficient documentation

## 2012-07-20 DIAGNOSIS — R0789 Other chest pain: Secondary | ICD-10-CM | POA: Insufficient documentation

## 2012-07-20 HISTORY — DX: Migraine, unspecified, not intractable, without status migrainosus: G43.909

## 2012-07-20 LAB — COMPREHENSIVE METABOLIC PANEL
ALT: 18 U/L (ref 0–53)
AST: 17 U/L (ref 0–37)
Albumin: 3.8 g/dL (ref 3.5–5.2)
Alkaline Phosphatase: 74 U/L (ref 39–117)
BUN: 7 mg/dL (ref 6–23)
Potassium: 3.2 mEq/L — ABNORMAL LOW (ref 3.5–5.1)
Sodium: 141 mEq/L (ref 135–145)
Total Protein: 6.9 g/dL (ref 6.0–8.3)

## 2012-07-20 LAB — TROPONIN I: Troponin I: <0.3 ng/mL (ref <0.30)

## 2012-07-20 LAB — CBC WITH DIFFERENTIAL/PLATELET
Basophils Relative: 0 % (ref 0–1)
Eosinophils Absolute: 0.2 10*3/uL (ref 0.0–0.7)
MCH: 30 pg (ref 26.0–34.0)
MCHC: 35.7 g/dL (ref 30.0–36.0)
Neutrophils Relative %: 55 % (ref 43–77)
Platelets: 209 10*3/uL (ref 150–400)
RBC: 5 MIL/uL (ref 4.22–5.81)

## 2012-07-20 MED ORDER — ALBUTEROL SULFATE (5 MG/ML) 0.5% IN NEBU
5.0000 mg | INHALATION_SOLUTION | Freq: Once | RESPIRATORY_TRACT | Status: AC
  Administered 2012-07-20: 5 mg via RESPIRATORY_TRACT
  Filled 2012-07-20: qty 1

## 2012-07-20 MED ORDER — KETOROLAC TROMETHAMINE 60 MG/2ML IM SOLN
60.0000 mg | Freq: Once | INTRAMUSCULAR | Status: AC
  Administered 2012-07-20: 60 mg via INTRAMUSCULAR
  Filled 2012-07-20: qty 2

## 2012-07-20 MED ORDER — METOCLOPRAMIDE HCL 10 MG PO TABS
10.0000 mg | ORAL_TABLET | Freq: Once | ORAL | Status: AC
  Administered 2012-07-20: 10 mg via ORAL
  Filled 2012-07-20: qty 1

## 2012-07-20 MED ORDER — HYDROCODONE-ACETAMINOPHEN 5-325 MG PO TABS
1.0000 | ORAL_TABLET | Freq: Once | ORAL | Status: AC
  Administered 2012-07-20: 1 via ORAL
  Filled 2012-07-20: qty 1

## 2012-07-20 MED ORDER — METOCLOPRAMIDE HCL 10 MG PO TABS: 10.0000 mg | ORAL_TABLET | Freq: Four times a day (QID) | ORAL | Status: DC | PRN

## 2012-07-20 MED ORDER — HYDROCODONE-ACETAMINOPHEN 5-325 MG PO TABS: 1.0000 | ORAL_TABLET | ORAL | Status: DC | PRN

## 2012-07-20 MED ORDER — IBUPROFEN 600 MG PO TABS: 600.0000 mg | ORAL_TABLET | Freq: Four times a day (QID) | ORAL | Status: DC | PRN

## 2012-07-20 NOTE — ED Notes (Signed)
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 C/o of associated "migraine".  Migraine 10/10. Chest pain 8/10

## 2012-07-20 NOTE — ED Notes (Signed)
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 Does not wear suggested face mask "because I cant breath with it on" constantly has black soot in his nose.

## 2012-07-20 NOTE — ED Provider Notes (Signed)
History  This chart was scribed for Loren Racer, MD by Bennett Scrape, ED Scribe. This patient was seen in room [facility] and the patient's care was started at 9:18 PM.  CSN: [id number]  Arrival date & time 07/20/12  2051   First MD Initiated Contact with Patient 07/20/12 2118      Chief Complaint  Patient presents with  . Chest Pain     Patient is a 27 y.o. male presenting with chest pain. The history is provided by the patient. No language interpreter was used.  Chest Pain Pain location:  Substernal area Pain quality: sharp   Radiates to: right axilla. Pain radiates to the back: no   Pain severity:  Mild Onset quality:  Sudden Duration:  8 hours Timing:  Constant Progression:  Worsening Chronicity:  New Relieved by:  Nothing Worsened by:  Deep breathing and movement Ineffective treatments:  None tried Associated symptoms: headache and shortness of breath   Associated symptoms: no cough, no fever, no nausea, no numbness and no weakness   Risk factors: smoking     HPI Comments: Shane Crawford is a 27 y.o. male who presents to the Emergency Department complaining of sudden onset, constant sternal CP described as sharp that radiates into the right axilla with associated mild SOB  that started 8 hours ago while working on a car. The pain is aggravated by deep breathing and movement and is rated an 8 out of 10. Pt denies having prior episodes of similar symptoms. He denies any recent long trips or surgeries. He denies having a h/o asthma. He denies nausea, emesis, leg swelling and leg pain as associated symptoms. He also states that he had a migraine earlier today that started before the CP. He has a h/o of migraines with similar symptoms. Pt is a current everyday smoker and occasional alcohol user.    Past Medical History  Diagnosis Date  . Migraines     Past Surgical History  Procedure Laterality Date  . Laparoscopy  10/08/2010    Procedure: LAPAROSCOPY  DIAGNOSTIC;  Surgeon: Fabio Bering;  Location: [facility];  Service: General;  Laterality: N/A;  . Laparoscopic appendectomy  10/08/2010    Procedure: APPENDECTOMY LAPAROSCOPIC;  Surgeon: Fabio Bering;  Location: [facility];  Service: General;  Laterality: N/A;  . Colonoscopy  10/23/2010    Procedure: COLONOSCOPY;  Surgeon: Dalia Heading;  Location: [facility];  Service: Gastroenterology;  Laterality: N/A;  . Appendectomy  09/2010  . Cholecystectomy  11/07/2010    Procedure: LAPAROSCOPIC CHOLECYSTECTOMY;  Surgeon: Fabio Bering;  Location: [facility];  Service: General;  Laterality: N/A;    Family History  Problem Relation Age of Onset  . Anesthesia problems Neg Hx   . Hypotension Neg Hx   . Malignant hyperthermia Neg Hx   . Pseudochol deficiency Neg Hx   . Cancer Mother   . Diabetes Mother   . Heart failure Mother   . Cancer Father   . Diabetes Father     History  Substance Use Topics  . Smoking status: Current Every Day Smoker -- 1.00 packs/day for 7 years    Types: Cigarettes    Last Attempt to Quit: 10/27/2010  . Smokeless tobacco: Not on file  . Alcohol Use: Yes     Comment: occassional- 1 beer q 3 weeks      Review of Systems  Constitutional: Negative for fever.  Respiratory: Positive for shortness of breath. Negative for cough.   Cardiovascular:  Positive for chest pain. Negative for leg swelling.  Gastrointestinal: Negative for nausea.  Neurological: Positive for headaches. Negative for weakness and numbness.    Allergies  Review of patient's allergies indicates no known allergies.  Home Medications   Current Outpatient Rx  Name  Route  Sig  Dispense  Refill  . naproxen sodium (ALEVE) 220 MG tablet   Oral   Take 440-880 mg by mouth daily as needed (for pain).         Marland Kitchen HYDROcodone-acetaminophen (NORCO) 5-325 MG per tablet   Oral   Take 1 tablet by mouth every 4 (four) hours as needed for pain.   10 tablet   0   . ibuprofen (ADVIL,MOTRIN) 600 MG  tablet   Oral   Take 1 tablet (600 mg total) by mouth every 6 (six) hours as needed for pain.   30 tablet   0   . metoCLOPramide (REGLAN) 10 MG tablet   Oral   Take 1 tablet (10 mg total) by mouth every 6 (six) hours as needed (nausea/headache).   6 tablet   0     Triage Vitals :BP 141/84  Pulse 82  Temp(Src) 97.9 F (36.6 C) (Oral)  SpO2 97%  Physical Exam  Nursing note and vitals reviewed. Constitutional: He is oriented to person, place, and time. He appears well-developed and well-nourished. No distress.  HENT:  Head: Normocephalic and atraumatic.  Mouth/Throat: Oropharynx is clear and moist.  No frontal sinus tenderness  Eyes: Conjunctivae and EOM are normal. Pupils are equal, round, and reactive to light.  Neck: Neck supple. No tracheal deviation present.  Cardiovascular: Normal rate and regular rhythm.   Pulmonary/Chest: Effort normal and breath sounds normal. No respiratory distress. He exhibits no tenderness (no reproducbile chest tenderness).  Abdominal: Soft. There is no tenderness.  Musculoskeletal: Normal range of motion. He exhibits no edema.  No calf swelling or tenderness  Neurological: He is alert and oriented to person, place, and time.  Skin: Skin is warm and dry.  Psychiatric: He has a normal mood and affect. His behavior is normal.    ED Course  Procedures (including critical care time)  Medications  ketorolac (TORADOL) injection 60 mg (60 mg Intramuscular Given 07/20/12 2144)  albuterol (PROVENTIL) (5 MG/ML) 0.5% nebulizer solution 5 mg (5 mg Nebulization Given 07/20/12 2300)  metoCLOPramide (REGLAN) tablet 10 mg (10 mg Oral Given 07/20/12 2352)  HYDROcodone-acetaminophen (NORCO/VICODIN) 5-325 MG per tablet 1 tablet (1 tablet Oral Given 07/20/12 2352)    DIAGNOSTIC STUDIES: Oxygen Saturation is 97% on room air, normal by my interpretation.    COORDINATION OF CARE: 9:34 PM-Advised pt that he is low risk for heart disease, MI or PE. Discussed  treatment plan which includes CXR, CBC panel, CMP and d-dimer with pt at bedside and pt agreed to plan.   Labs Reviewed  COMPREHENSIVE METABOLIC PANEL - Abnormal; Notable for the following:    Potassium 3.2 (*)    All other components within normal limits  CBC WITH DIFFERENTIAL  TROPONIN I  D-DIMER, QUANTITATIVE   Dg Chest 2 View  07/20/2012   *RADIOLOGY REPORT*  Clinical Data: Chest pain  CHEST - 2 VIEW  Comparison: Chest radiograph 03/30/2012  Findings: Normal mediastinum and cardiac silhouette.  Normal pulmonary  vasculature.  No evidence of effusion, infiltrate, or pneumothorax.  No acute bony abnormality.  Cholecystectomy clips noted.  Nipple rings noted.  IMPRESSION: No acute cardiopulmonary process.   Original Report Authenticated By: Genevive Bi, M.D.  1. Atypical chest pain   2. Migraine       MDM  I personally performed the services described in this documentation, which was scribed in my presence. The recorded information has been reviewed and is accurate.        Loren Racer, MD 07/21/12 [phone]

## 2012-10-16 ENCOUNTER — Ambulatory Visit (HOSPITAL_COMMUNITY)
Admission: RE | Admit: 2012-10-16 | Discharge: 2012-10-16 | Disposition: A | Discharge status: Home / Self Care | Payer: BC Managed Care – PPO | Source: Ambulatory Visit | Attending: Family Medicine | Admitting: Family Medicine

## 2012-10-16 ENCOUNTER — Other Ambulatory Visit (HOSPITAL_COMMUNITY): Payer: Self-pay | Admitting: Family Medicine

## 2012-10-16 DIAGNOSIS — R05 Cough: Secondary | ICD-10-CM

## 2012-10-16 DIAGNOSIS — R059 Cough, unspecified: Secondary | ICD-10-CM | POA: Insufficient documentation

## 2013-02-07 IMAGING — CT CT ABD-PELV W/ CM
2 of 3 series · 17 of 46 positions shown, 19 images · IV contrast (Omnipaque 300)
Comparison: 09/15/2006 and earlier.

CLINICAL DATA: 24-year-old male right abdominal pain with diarrhea
and nausea.

CT ABDOMEN AND PELVIS WITH CONTRAST
TECHNIQUE: Multidetector CT imaging of the abdomen and pelvis was
performed following the standard protocol during bolus
administration of intravenous contrast.
Contrast: 100 ml 2mnipaque-PVV.

[Series 2: abd_pel_with 5.0 b40s · axial · 0.85mm/px · z∈[-504,-39]mm · 14 of 107 slices shown, 16 images]
[im 7/107  soft-tissue]
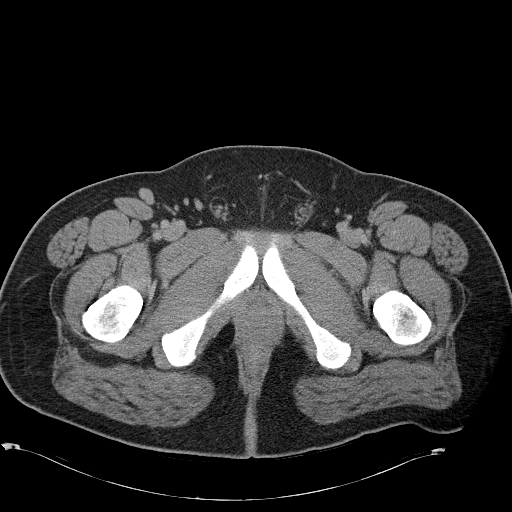
[im 7/107  bone]
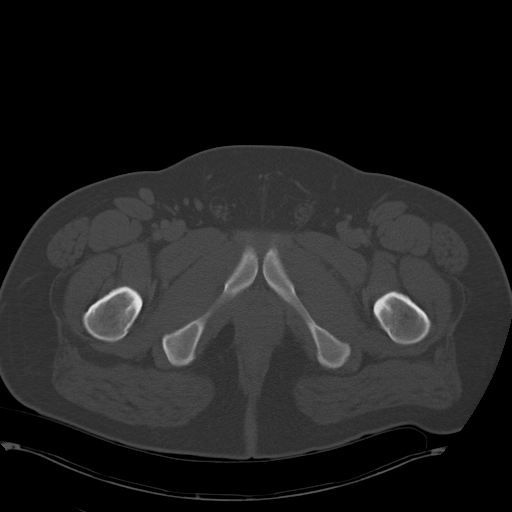
[im 14/107  soft-tissue]
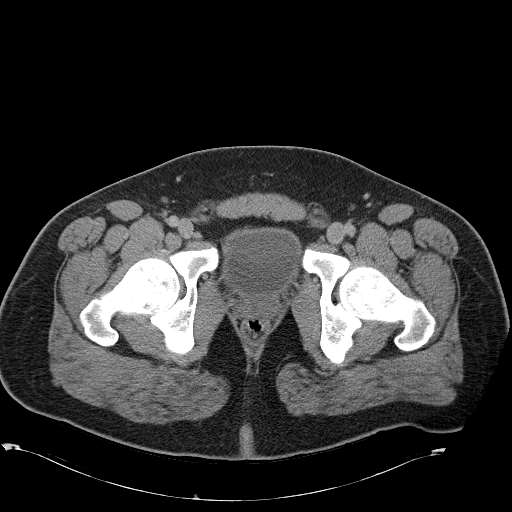
[im 21/107  soft-tissue]
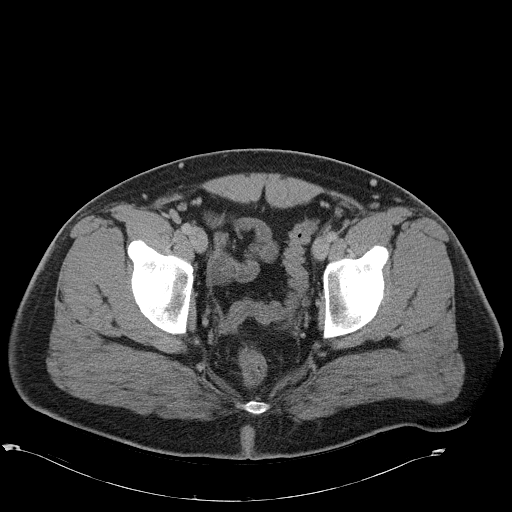
[im 28/107  soft-tissue]
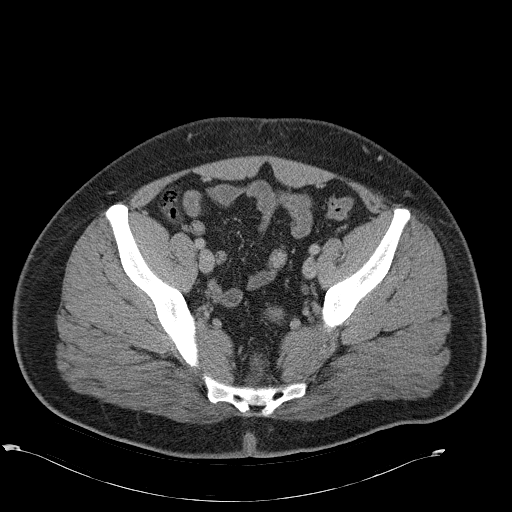
[im 35/107  soft-tissue]
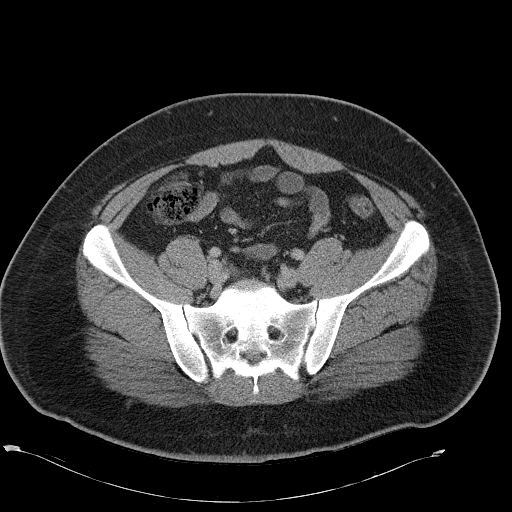
[im 42/107  soft-tissue]
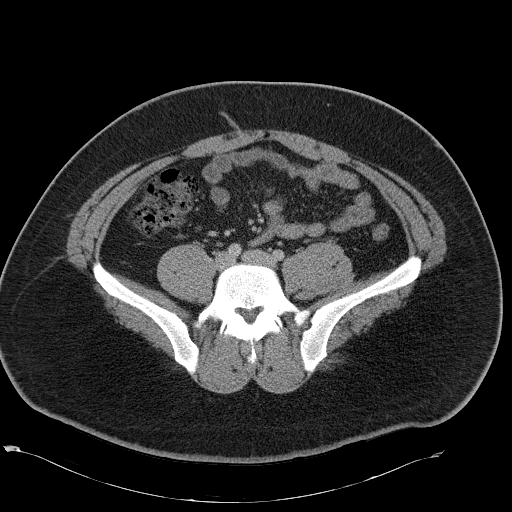
[im 48/107  soft-tissue]
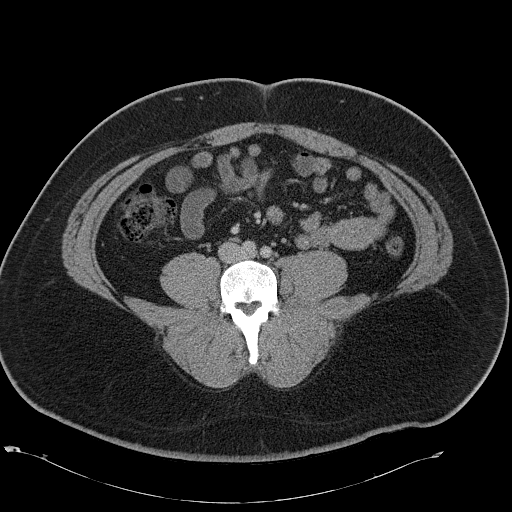
[im 59/107  soft-tissue]
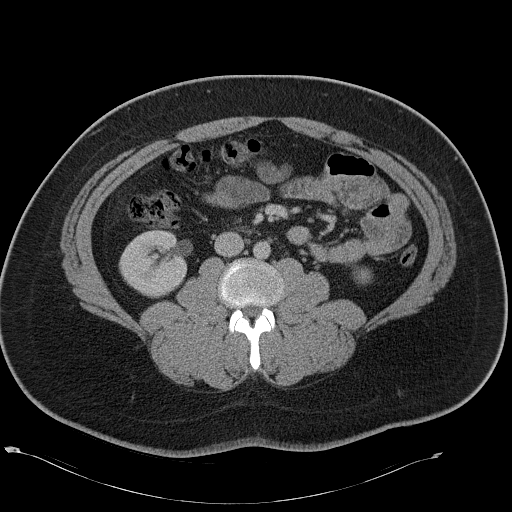
[im 65/107  soft-tissue]
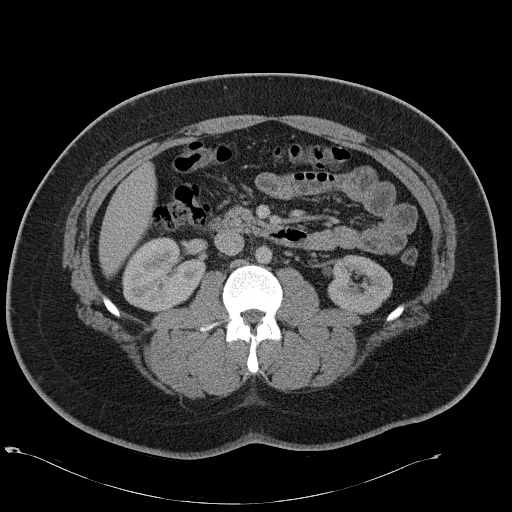
[im 65/107  bone]
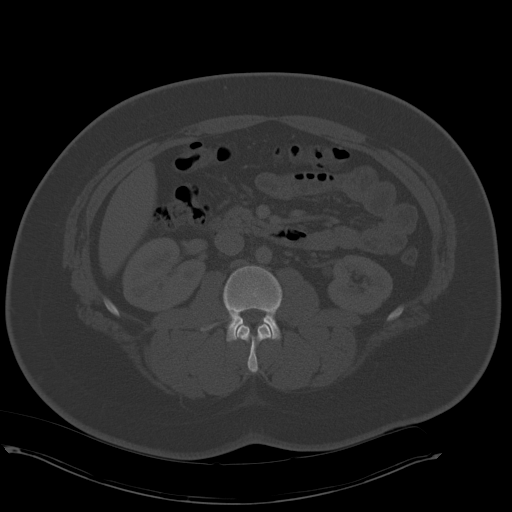
[im 72/107  soft-tissue]
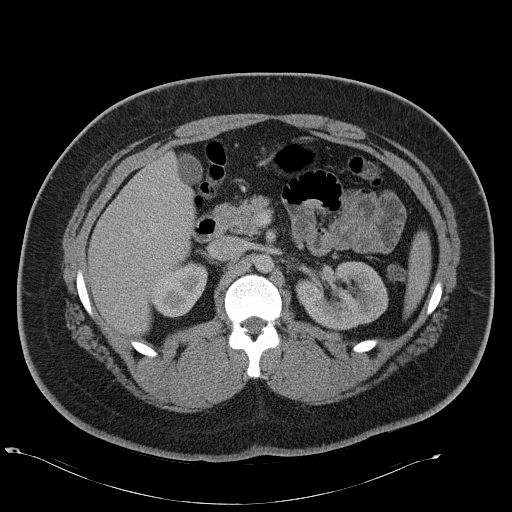
[im 79/107  soft-tissue]
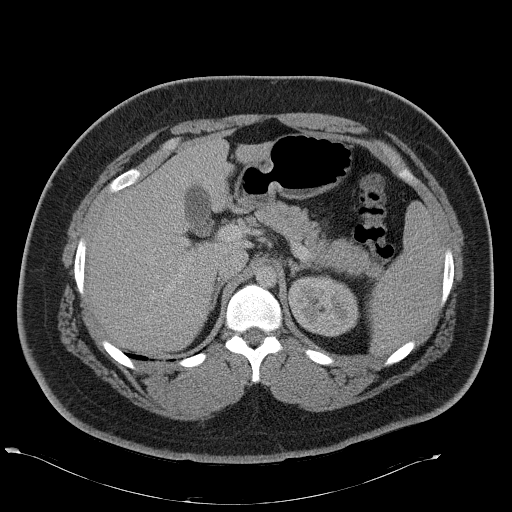
[im 86/107  soft-tissue]
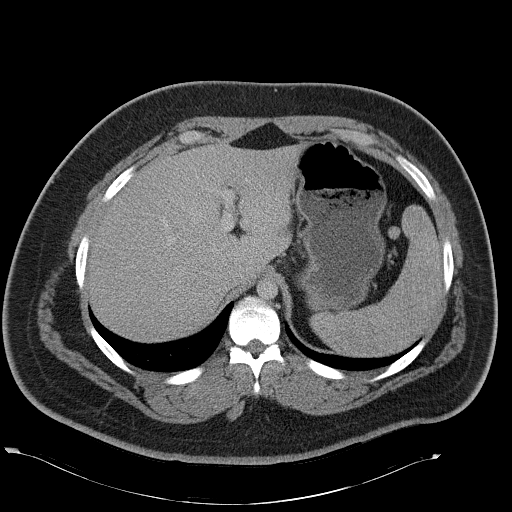
[im 93/107  soft-tissue]
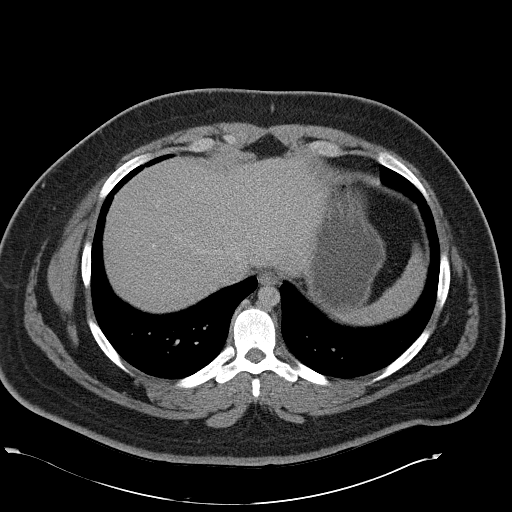
[im 100/107  soft-tissue]
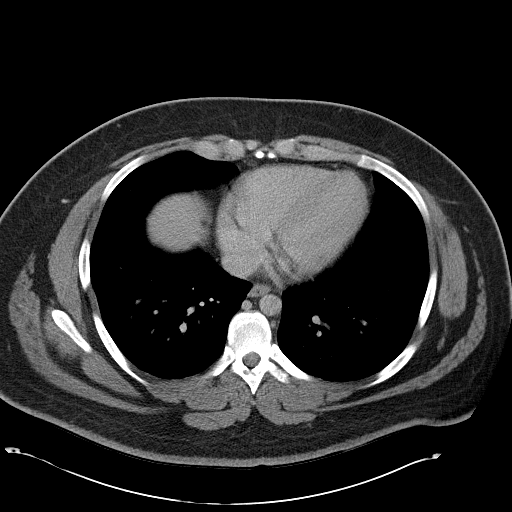

[Series 4: mpr cor post contrast (id) · coronal · 0.90mm/px · 3 of 127 slices shown]
[im 43/127  soft-tissue]
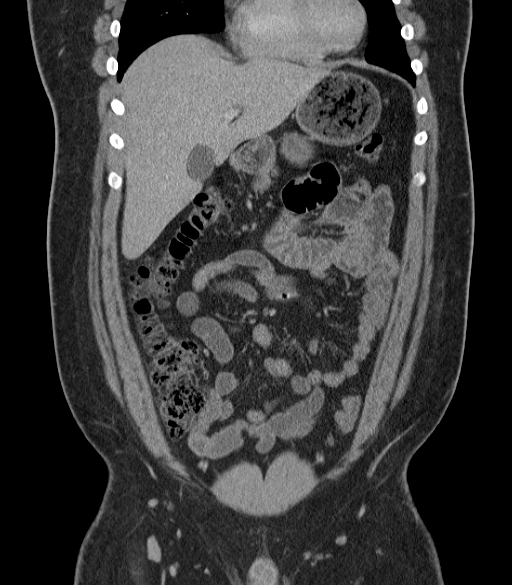
[im 57/127  soft-tissue]
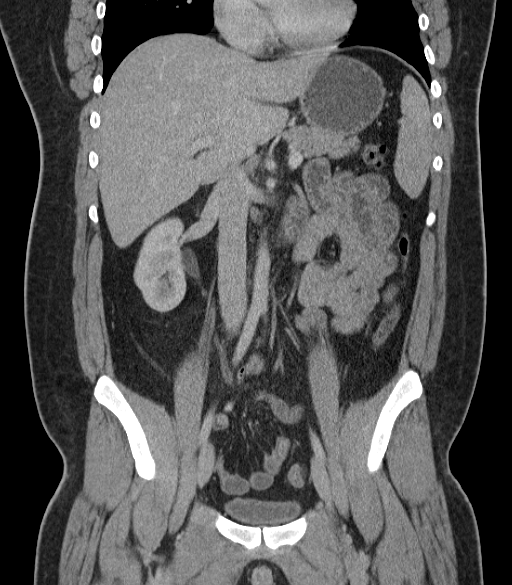
[im 71/127  soft-tissue]
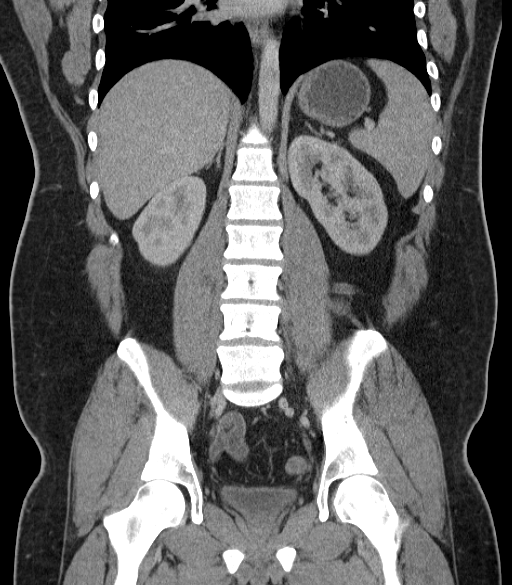

[17 of 46 positions shown; findings below may reference images not displayed]

FINDINGS: Minor lung base atelectasis. No acute osseous abnormality
identified.  No pelvic free fluid.  Decompressed bladder.  Negative
distal colon.  Much of the colon is decompressed.

Normal appendix (series 2 image 76, tracking to the midline image
69).  Normal terminal ileum.  No dilated or abnormal small bowel
identified.

Suboptimal intravascular contrast bolus.  Negative stomach,
duodenum, liver, gallbladder, spleen, pancreas, adrenal glands,
kidneys, portal venous system, and major arterial structures.  No
abdominal free fluid.  No lymphadenopathy.
IMPRESSION: Normal appendix.  Normal CT of the abdomen pelvis.  Minor
atelectasis.

## 2013-03-05 ENCOUNTER — Other Ambulatory Visit: Payer: Self-pay | Admitting: Radiology

## 2013-03-05 ENCOUNTER — Encounter (HOSPITAL_COMMUNITY): Payer: Self-pay

## 2013-03-05 ENCOUNTER — Encounter (HOSPITAL_COMMUNITY): Payer: Self-pay | Admitting: Pharmacy Technician

## 2013-03-05 ENCOUNTER — Encounter (HOSPITAL_COMMUNITY)
Admission: RE | Admit: 2013-03-05 | Discharge: 2013-03-05 | Disposition: A | Discharge status: Home / Self Care | Payer: BC Managed Care – PPO | Source: Ambulatory Visit | Attending: Orthopaedic Surgery | Admitting: Orthopaedic Surgery

## 2013-03-05 DIAGNOSIS — Z01812 Encounter for preprocedural laboratory examination: Secondary | ICD-10-CM | POA: Insufficient documentation

## 2013-03-05 DIAGNOSIS — Z01818 Encounter for other preprocedural examination: Secondary | ICD-10-CM | POA: Insufficient documentation

## 2013-03-05 LAB — BASIC METABOLIC PANEL
BUN: 13 mg/dL (ref 6–23)
CALCIUM: 9.6 mg/dL (ref 8.4–10.5)
CO2: 27 mEq/L (ref 19–32)
CREATININE: 0.85 mg/dL (ref 0.50–1.35)
Chloride: 105 mEq/L (ref 96–112)
GFR calc Af Amer: >90 mL/min (ref >90)
GLUCOSE: 85 mg/dL (ref 70–99)
Potassium: 4.2 mEq/L (ref 3.7–5.3)
SODIUM: 144 meq/L (ref 137–147)

## 2013-03-05 LAB — URINALYSIS, ROUTINE W REFLEX MICROSCOPIC
Bilirubin Urine: NEGATIVE
GLUCOSE, UA: NEGATIVE mg/dL
Hgb urine dipstick: NEGATIVE
KETONES UR: NEGATIVE mg/dL
LEUKOCYTES UA: NEGATIVE
NITRITE: NEGATIVE
PROTEIN: NEGATIVE mg/dL
Specific Gravity, Urine: 1.025 (ref 1.005–1.030)
UROBILINOGEN UA: 0.2 mg/dL (ref 0.0–1.0)
pH: 6 (ref 5.0–8.0)

## 2013-03-05 LAB — CBC WITH DIFFERENTIAL/PLATELET
BASOS ABS: 0 10*3/uL (ref 0.0–0.1)
BASOS PCT: 0 % (ref 0–1)
EOS ABS: 0.2 10*3/uL (ref 0.0–0.7)
EOS PCT: 3 % (ref 0–5)
HCT: 44.2 % (ref 39.0–52.0)
Hemoglobin: 15.7 g/dL (ref 13.0–17.0)
LYMPHS ABS: 3.4 10*3/uL (ref 0.7–4.0)
Lymphocytes Relative: 46 % (ref 12–46)
MCH: 30.2 pg (ref 26.0–34.0)
MCHC: 35.5 g/dL (ref 30.0–36.0)
MCV: 85 fL (ref 78.0–100.0)
Monocytes Absolute: 0.7 10*3/uL (ref 0.1–1.0)
Monocytes Relative: 9 % (ref 3–12)
NEUTROS PCT: 43 % (ref 43–77)
Neutro Abs: 3.2 10*3/uL (ref 1.7–7.7)
PLATELETS: 220 10*3/uL (ref 150–400)
RBC: 5.2 MIL/uL (ref 4.22–5.81)
RDW: 12.2 % (ref 11.5–15.5)
WBC: 7.5 10*3/uL (ref 4.0–10.5)

## 2013-03-05 NOTE — Patient Instructions (Signed)
Shane CarawayChristopher M Crawford  03/05/2013   Your procedure is scheduled on:  03/09/2013  [facility] at  615  AM.  Call this number if you have problems the morning of surgery: [phone]   Remember:   Do not eat food or drink liquids after midnight.   Take these medicines the morning of surgery with A SIP OF WATER: hydrocodone, reglan   Do not wear jewelry, make-up or nail polish.  Do not wear lotions, powders, or perfumes.  Do not shave 48 hours prior to surgery. Men may shave face and neck.  Do not bring valuables to the hospital.  [facility] is not [facility] for any belongings or valuables.               Contacts, dentures or bridgework may not be worn into surgery.  Leave suitcase in the car. After surgery it may be brought to your room.  For patients admitted to the hospital, discharge time is determined by your treatment team.               Patients discharged the day of surgery will not be allowed to drive home.  Name and phone number of your driver: family  Special Instructions: Shower using CHG 2 nights before surgery and the night before surgery.  If you shower the day of surgery use CHG.  Use special wash - you have one bottle of CHG for all showers.  You should use approximately 1/3 of the bottle for each shower.   Please read over the following fact sheets that you were given: Pain Booklet, Coughing and Deep Breathing, Surgical Site Infection Prevention, Anesthesia Post-op Instructions and Care and Recovery After Surgery Carpal Tunnel Release Carpal tunnel release is done to relieve the pressure on the nerves and tendons on the bottom side of your wrist.  LET YOUR CAREGIVER KNOW ABOUT:   Allergies to food or medicine.  Medicines taken, including vitamins, herbs, eyedrops, over-the-counter medicines, and creams.  Use of steroids (by mouth or creams).  Previous problems with anesthetics or numbing medicines.  History of bleeding problems or blood  clots.  Previous surgery.  Other health problems, including diabetes and kidney problems.  Possibility of pregnancy, if this applies. RISKS AND COMPLICATIONS  Some problems that may happen after this procedure include:  Infection.  Damage to the nerves, arteries or tendons could occur. This would be very uncommon.  Bleeding. BEFORE THE PROCEDURE   This surgery may be done while you are asleep (general anesthetic) or may be done under a block where only your forearm and the surgical area is numb.  If the surgery is done under a block, the numbness will gradually wear off within several hours after surgery. HOME CARE INSTRUCTIONS   Have a responsible person with you for 24 hours.  Do not drive a car or use public transportation for 24 hours.  Only take over-the-counter or prescription medicines for pain, discomfort, or fever as directed by your caregiver. Take them as directed.  You may put ice on the palm side of the affected wrist.  Put ice in a plastic bag.  Place a towel between your skin and the bag.  Leave the ice on for 20 to 30 minutes, 4 times per day.  If you were given a splint to keep your wrist from bending, use it as directed. It is important to wear the splint at night or as directed. Use the splint for as long  as you have pain or numbness in your hand, arm, or wrist. This may take 1 to 2 months.  Keep your hand raised (elevated) above the level of your heart as much as possible. This keeps swelling down and helps with discomfort.  Change bandages (dressings) as directed.  Keep the wound clean and dry. SEEK MEDICAL CARE IF:   You develop pain not relieved with medications.  You develop numbness of your hand.  You develop bleeding from your surgical site.  You have an oral temperature above 102 F (38.9 C).  You develop redness or swelling of the surgical site.  You develop new, unexplained problems. SEEK IMMEDIATE MEDICAL CARE IF:   You develop  a rash.  You have difficulty breathing.  You develop any reaction or side effects to medications given. Document Released: 05/04/2003 Document Revised: 05/06/2011 Document Reviewed: 12/18/2006 [facility] Patient Information 2014 [place], [place]. PATIENT INSTRUCTIONS POST-ANESTHESIA  IMMEDIATELY FOLLOWING SURGERY:  Do not drive or operate machinery for the first twenty four hours after surgery.  Do not make any important decisions for twenty four hours after surgery or while taking narcotic pain medications or sedatives.  If you develop intractable nausea and vomiting or a severe headache please notify your doctor immediately.  FOLLOW-UP:  Please make an appointment with your surgeon as instructed. You do not need to follow up with anesthesia unless specifically instructed to do so.  WOUND CARE INSTRUCTIONS (if applicable):  Keep a dry clean dressing on the anesthesia/puncture wound site if there is drainage.  Once the wound has quit draining you may leave it open to air.  Generally you should leave the bandage intact for twenty four hours unless there is drainage.  If the epidural site drains for more than 36-48 hours please call the anesthesia department.  QUESTIONS?:  Please feel free to call your physician or the hospital operator if you have any questions, and they will be happy to assist you.

## 2013-03-08 NOTE — H&P (Signed)
Shane Crawford is an 28 y.o. male.   Chief Complaint: Carpal Tunnel Syndrome Right HPI: patient has had pain and tenderness in the median nerve distribution for about two years now.  He has had EMG's showing severe compression of the right median nerve at the wrist and moderate on the left.  EMG's were ordered by Dr. Ethelene Hal in [facility] spring, 2013.  The patient did not want surgery then.  He wanted to see how he did.  He has tried splinting, medicine NSAID and pain medicine.   He continues to have pain and it is getting worse. It used to be more pronounced at night but now bothers him with prolonged driving and any type of repetitive use of his hand.  He would like surgery now.  I have gone over the risks and imponderables of the elective procedure of carpal tunnel release.  He appears to understand this and agrees to the procedure.  Past Medical History  Diagnosis Date  . Migraines     Past Surgical History  Procedure Laterality Date  . Laparoscopy  10/08/2010    Procedure: LAPAROSCOPY DIAGNOSTIC;  Surgeon: Fabio Bering;  Location: [facility];  Service: General;  Laterality: N/A;  . Laparoscopic appendectomy  10/08/2010    Procedure: APPENDECTOMY LAPAROSCOPIC;  Surgeon: Fabio Bering;  Location: [facility];  Service: General;  Laterality: N/A;  . Colonoscopy  10/23/2010    Procedure: COLONOSCOPY;  Surgeon: Dalia Heading;  Location: [facility];  Service: Gastroenterology;  Laterality: N/A;  . Appendectomy  09/2010  . Cholecystectomy  11/07/2010    Procedure: LAPAROSCOPIC CHOLECYSTECTOMY;  Surgeon: Fabio Bering;  Location: [facility];  Service: General;  Laterality: N/A;    Family History  Problem Relation Age of Onset  . Anesthesia problems Neg Hx   . Hypotension Neg Hx   . Malignant hyperthermia Neg Hx   . Pseudochol deficiency Neg Hx   . Cancer Mother   . Diabetes Mother   . Heart failure Mother   . Cancer Father   . Diabetes Father    Social History:  reports that he has been  smoking Cigarettes.  He has a 7 pack-year smoking history. He does not have any smokeless tobacco history on file. He reports that he drinks alcohol. He reports that he does not use illicit drugs.  Allergies: No Known Allergies  No prescriptions prior to admission    No results found for this or any previous visit (from the past 48 hour(s)). No results found.  Review of Systems  Musculoskeletal: Positive for joint pain (Chronic right shoulder pain.  History of carpal tunnel for last couple of years, documented with EMG by Dr. Ethelene Hal in [place].).    There were no vitals taken for this visit. Physical Exam  Constitutional: He is oriented to person, place, and time. He appears well-developed and well-nourished.  HENT:  Head: Normocephalic and atraumatic.  Eyes: Conjunctivae and EOM are normal. Pupils are equal, round, and reactive to light.  Neck: Normal range of motion. Neck supple.  Cardiovascular: Normal rate, regular rhythm, normal heart sounds and intact distal pulses.   Respiratory: Effort normal and breath sounds normal.  GI: Bowel sounds are normal.  Musculoskeletal: He exhibits tenderness (Pain right wrist and hand in median nerve distribution with positive Phalen's and Tinel's signs over the right wrist.  Mild pain left wrist.).  Neurological: He is alert and oriented to person, place, and time. He has normal reflexes.  Skin: Skin is  warm and dry.  Psychiatric: He has a normal mood and affect. His behavior is normal. Judgment normal.     Assessment/Plan Carpal Tunnel Syndrome, right.  For right carpal tunnel release.  Harneet Noblett 03/08/2013, 8:13 AM

## 2013-03-09 ENCOUNTER — Ambulatory Visit (HOSPITAL_COMMUNITY): Payer: BC Managed Care – PPO | Admitting: Anesthesiology

## 2013-03-09 ENCOUNTER — Encounter (HOSPITAL_COMMUNITY): Payer: BC Managed Care – PPO | Admitting: Anesthesiology

## 2013-03-09 ENCOUNTER — Encounter (HOSPITAL_COMMUNITY)
Admission: RE | Disposition: A | Discharge status: Home / Self Care | Payer: Self-pay | Source: Ambulatory Visit | Attending: Orthopaedic Surgery

## 2013-03-09 ENCOUNTER — Ambulatory Visit (HOSPITAL_COMMUNITY)
Admission: RE | Admit: 2013-03-09 | Discharge: 2013-03-09 | Disposition: A | Discharge status: Home / Self Care | Payer: BC Managed Care – PPO | Source: Ambulatory Visit | Attending: Orthopaedic Surgery | Admitting: Orthopaedic Surgery

## 2013-03-09 ENCOUNTER — Encounter (HOSPITAL_COMMUNITY): Payer: Self-pay | Admitting: *Deleted

## 2013-03-09 DIAGNOSIS — G5600 Carpal tunnel syndrome, unspecified upper limb: Secondary | ICD-10-CM | POA: Insufficient documentation

## 2013-03-09 HISTORY — PX: CARPAL TUNNEL RELEASE: SHX101

## 2013-03-09 SURGERY — CARPAL TUNNEL RELEASE
Anesthesia: Monitor Anesthesia Care | Laterality: Right

## 2013-03-09 MED ORDER — MIDAZOLAM HCL 2 MG/2ML IJ SOLN
INTRAMUSCULAR | Status: AC
  Filled 2013-03-09: qty 2

## 2013-03-09 MED ORDER — FENTANYL CITRATE 0.05 MG/ML IJ SOLN
25.0000 ug | INTRAMUSCULAR | Status: DC | PRN
  Administered 2013-03-09: 50 ug via INTRAVENOUS

## 2013-03-09 MED ORDER — FENTANYL CITRATE 0.05 MG/ML IJ SOLN
INTRAMUSCULAR | Status: AC
  Filled 2013-03-09: qty 2

## 2013-03-09 MED ORDER — MIDAZOLAM HCL 2 MG/2ML IJ SOLN
1.0000 mg | INTRAMUSCULAR | Status: DC | PRN
  Administered 2013-03-09: 2 mg via INTRAVENOUS

## 2013-03-09 MED ORDER — SODIUM CHLORIDE 0.9 % IJ SOLN
INTRAMUSCULAR | Status: AC
  Filled 2013-03-09: qty 10

## 2013-03-09 MED ORDER — PROPOFOL 10 MG/ML IV EMUL
INTRAVENOUS | Status: AC
  Filled 2013-03-09: qty 20

## 2013-03-09 MED ORDER — SODIUM CHLORIDE 0.9 % IV SOLN
INTRAVENOUS | Status: DC | PRN
  Administered 2013-03-09: 2 mL via INTRAMUSCULAR

## 2013-03-09 MED ORDER — FENTANYL CITRATE 0.05 MG/ML IJ SOLN
INTRAMUSCULAR | Status: DC | PRN
  Administered 2013-03-09 (×6): 50 ug via INTRAVENOUS

## 2013-03-09 MED ORDER — OXYCODONE-ACETAMINOPHEN 7.5-325 MG PO TABS: 1.0000 | ORAL_TABLET | ORAL | Status: DC | PRN

## 2013-03-09 MED ORDER — CHLORHEXIDINE GLUCONATE 4 % EX LIQD: 60.0000 mL | Freq: Once | CUTANEOUS | Status: DC

## 2013-03-09 MED ORDER — LIDOCAINE HCL (PF) 1 % IJ SOLN
INTRAMUSCULAR | Status: AC
  Filled 2013-03-09: qty 5

## 2013-03-09 MED ORDER — LIDOCAINE HCL (PF) 0.5 % IJ SOLN
INTRAMUSCULAR | Status: DC | PRN
  Administered 2013-03-09: 50 mL

## 2013-03-09 MED ORDER — FENTANYL CITRATE 0.05 MG/ML IJ SOLN
25.0000 ug | INTRAMUSCULAR | Status: AC
  Administered 2013-03-09 (×2): 25 ug via INTRAVENOUS

## 2013-03-09 MED ORDER — SUCCINYLCHOLINE CHLORIDE 20 MG/ML IJ SOLN
INTRAMUSCULAR | Status: AC
  Filled 2013-03-09: qty 1

## 2013-03-09 MED ORDER — SODIUM CHLORIDE 0.9 % IR SOLN
Status: DC | PRN
  Administered 2013-03-09: 1000 mL

## 2013-03-09 MED ORDER — PROPOFOL INFUSION 10 MG/ML OPTIME
INTRAVENOUS | Status: DC | PRN
  Administered 2013-03-09: 50 ug/kg/min via INTRAVENOUS

## 2013-03-09 MED ORDER — ONDANSETRON HCL 4 MG/2ML IJ SOLN: 4.0000 mg | Freq: Once | INTRAMUSCULAR | Status: DC | PRN

## 2013-03-09 MED ORDER — LACTATED RINGERS IV SOLN
INTRAVENOUS | Status: DC
Start: 2013-03-09 — End: 2013-03-09
  Administered 2013-03-09: 1000 mL via INTRAVENOUS

## 2013-03-09 MED ORDER — MIDAZOLAM HCL 5 MG/5ML IJ SOLN
INTRAMUSCULAR | Status: DC | PRN
  Administered 2013-03-09 (×2): 2 mg via INTRAVENOUS

## 2013-03-09 MED ORDER — LACTATED RINGERS IV SOLN
INTRAVENOUS | Status: DC | PRN
  Administered 2013-03-09: 07:00:00 via INTRAVENOUS

## 2013-03-09 MED ORDER — LIDOCAINE HCL (PF) 0.5 % IJ SOLN
INTRAMUSCULAR | Status: AC
  Filled 2013-03-09: qty 50

## 2013-03-09 SURGICAL SUPPLY — 39 items
BAG HAMPER (MISCELLANEOUS) ×3 IMPLANT
BANDAGE ELASTIC 3 VELCRO NS (GAUZE/BANDAGES/DRESSINGS) ×3 IMPLANT
BANDAGE ESMARK 4X12 BL STRL LF (DISPOSABLE) ×1 IMPLANT
BLADE SURG 15 STRL LF DISP TIS (BLADE) ×1 IMPLANT
BLADE SURG 15 STRL SS (BLADE) ×3
BNDG CMPR 12X4 ELC STRL LF (DISPOSABLE) ×1
BNDG ESMARK 4X12 BLUE STRL LF (DISPOSABLE) ×3
CLOTH BEACON ORANGE TIMEOUT ST (SAFETY) ×3 IMPLANT
COVER LIGHT HANDLE STERIS (MISCELLANEOUS) ×6 IMPLANT
CUFF TOURNIQUET SINGLE 24IN (TOURNIQUET CUFF) ×2 IMPLANT
DRSG XEROFORM 1X8 (GAUZE/BANDAGES/DRESSINGS) ×2 IMPLANT
DURAPREP 26ML APPLICATOR (WOUND CARE) ×3 IMPLANT
ELECT NDL TIP 2.8 STRL (NEEDLE) IMPLANT
ELECT NEEDLE TIP 2.8 STRL (NEEDLE) ×3 IMPLANT
ELECT REM PT RETURN 9FT ADLT (ELECTROSURGICAL) ×3
ELECTRODE REM PT RTRN 9FT ADLT (ELECTROSURGICAL) ×1 IMPLANT
FORMALIN 10 PREFIL 120ML (MISCELLANEOUS) ×3 IMPLANT
GLOVE BIO SURGEON STRL SZ8 (GLOVE) ×3 IMPLANT
GLOVE BIO SURGEON STRL SZ8.5 (GLOVE) ×3 IMPLANT
GLOVE BIOGEL PI IND STRL 7.0 (GLOVE) IMPLANT
GLOVE BIOGEL PI INDICATOR 7.0 (GLOVE) ×4
GLOVE ECLIPSE 6.5 STRL STRAW (GLOVE) ×2 IMPLANT
GOWN STRL REIN XL XLG (GOWN DISPOSABLE) ×6 IMPLANT
KIT ROOM TURNOVER APOR (KITS) ×3 IMPLANT
NDL HYPO 18GX1.5 BLUNT FILL (NEEDLE) ×1 IMPLANT
NDL HYPO 27GX1-1/4 (NEEDLE) ×1 IMPLANT
NEEDLE HYPO 18GX1.5 BLUNT FILL (NEEDLE) ×3 IMPLANT
NEEDLE HYPO 27GX1-1/4 (NEEDLE) ×3 IMPLANT
NS IRRIG 1000ML POUR BTL (IV SOLUTION) ×3 IMPLANT
PACK BASIC LIMB (CUSTOM PROCEDURE TRAY) ×3 IMPLANT
PAD ARMBOARD 7.5X6 YLW CONV (MISCELLANEOUS) ×3 IMPLANT
PAD CAST 3X4 CTTN HI CHSV (CAST SUPPLIES) ×1 IMPLANT
PADDING CAST COTTON 3X4 STRL (CAST SUPPLIES) ×3
SET BASIN LINEN APH (SET/KITS/TRAYS/PACK) ×3 IMPLANT
SPONGE GAUZE 4X4 12PLY (GAUZE/BANDAGES/DRESSINGS) ×3 IMPLANT
SUT ETHILON 3 0 FSL (SUTURE) ×3 IMPLANT
SYR 3ML LL SCALE MARK (SYRINGE) ×3 IMPLANT
TOWEL OR 17X26 4PK STRL BLUE (TOWEL DISPOSABLE) ×3 IMPLANT
VESSEL LOOPS MAXI RED (MISCELLANEOUS) ×3 IMPLANT

## 2013-03-09 NOTE — Discharge Instructions (Signed)
Elevate right hand as needed.  Keep wound dry.  Do not remove dressing.  See Dr. Hilda LiasKeeling in one week.  If any problem, call his office at [phone] or if after hours, hospital at [phone].

## 2013-03-09 NOTE — Op Note (Signed)
NAME:  Shane Crawford, Shane Crawford           ACCOUNT NO.:  [id number]  MEDICAL RECORD NO.:  [id number]  LOCATION:  [facility]                          FACILITY:  [facility]  PHYSICIAN:  J. Darreld McleanWayne Cherly Erno, M.D. DATE OF BIRTH:  May 03, 1985  DATE OF PROCEDURE: DATE OF DISCHARGE:                              OPERATIVE REPORT   PREOPERATIVE DIAGNOSIS:  Carpal tunnel syndrome, right.  POSTOPERATIVE DIAGNOSIS:  Carpal tunnel syndrome, right.  PROCEDURE:  Open release of volar carpal ligament, saline neurolysis, epineurotomy right median nerve.  ANESTHESIA:  Regional placed for anesthesia record for tourniquet time.  DRAINS:  No drains.  SURGEON:  J. Darreld McleanWayne Jannatul Wojdyla, MD  INDICATIONS:  The patient has pain and tenderness in his right hand for about 2 years.  It has been progressively worse.  EMGs done in 2013 showed severe carpal tunnel syndrome on the right, moderate to mild on the left.  His right has gotten worse.  He has tried splints, conservative treatment and anti-inflammatories without success.  It is getting worse.  It used to bother him mainly at night, and now is bothering him most of the time particularly if he drives for a period of time, holds the steering while stationary or activity such as that.  I have talked to him about surgical correction, he appears to understand and agrees to proceed with the procedure as outlined.  I went over the risks and imponderables.  The patient was seen in the holding area.  The right hand was identified as correct surgical site.  I placed a mark on the right wrist over the site of the surgery.  He was brought to the operating room, placed supine on the operating room table and hand table attached.  He was given a Bier block anesthesia.  Once this had taken effect, he was prepped and draped in the usual manner.  A generalized time-out identifying Mr. Katrinka BlazingSmith as the patient and his right volar wrist for carpal tunnel release.  All instrumentations were properly  positioned and working.  The OR team knew each other.  I made an outline for incision and with careful dissection, the median nerve was identified proximally.  A vessel loop placed around the nerve. A groove direct was placed in the carpal tunnel space and the volar carpal ligament excised.  Specimen of volar carpal ligament sent to pathology.  Retinaculum cut proximally.  The nerve was obviously compressed and saline neurolysis was carried out.  Epineurotomy was carried out.  The wound was then reapproximated using a 3-0 nylon interrupted vertical mattress manner.  Sterile dressing applied.  Bulky dressing applied.  Sheet cotton applied.  Sheet of cotton cut dorsally.  ACE bandage applied loosely.  The patient tolerated the procedure well. Given appropriate analgesia for pain.  I will see him in my office in 1 week.  Any difficulties contact me through the office hospital beeper system.          ______________________________ Shela CommonsJ. Darreld McleanWayne Miliyah Luper, M.D.     JWK/MEDQ  D:  03/09/2013  T:  03/09/2013  Job:  [id number]

## 2013-03-09 NOTE — Anesthesia Preprocedure Evaluation (Signed)
Anesthesia Evaluation  Patient identified by MRN, date of birth, ID band Patient awake    Reviewed: Allergy & Precautions, H&P , NPO status , Patient's Chart, lab work & pertinent test results  History of Anesthesia Complications Negative for: history of anesthetic complications  Airway Mallampati: I TM Distance: >3 FB     Dental  (+) Teeth Intact   Pulmonary neg pulmonary ROS, Current Smoker,  breath sounds clear to auscultation        Cardiovascular negative cardio ROS  Rhythm:Regular Rate:Normal     Neuro/Psych  Headaches,    GI/Hepatic negative GI ROS,   Endo/Other  Morbid obesity  Renal/GU      Musculoskeletal   Abdominal (+) - obese, scaphoid   Peds  Hematology   Anesthesia Other Findings   Reproductive/Obstetrics                           Anesthesia Physical Anesthesia Plan  ASA: II  Anesthesia Plan: Bier Block and MAC   Post-op Pain Management:    Induction: Intravenous  Airway Management Planned: Nasal Cannula  Additional Equipment:   Intra-op Plan:   Post-operative Plan:   Informed Consent: I have reviewed the patients History and Physical, chart, labs and discussed the procedure including the risks, benefits and alternatives for the proposed anesthesia with the patient or authorized representative who has indicated his/her understanding and acceptance.     Plan Discussed with:   Anesthesia Plan Comments:         Anesthesia Quick Evaluation

## 2013-03-09 NOTE — Anesthesia Postprocedure Evaluation (Signed)
  Anesthesia Post-op Note  Patient: Shane CarawayChristopher M Crawford  Procedure(s) Performed: Procedure(s): RIGHT CARPAL TUNNEL RELEASE OPEN (Right)  Patient Location: PACU  Anesthesia Type:Bier block  Level of Consciousness: awake, alert , oriented and patient cooperative  Airway and Oxygen Therapy: Patient connected to face mask oxygen  Post-op Pain: mild  Post-op Assessment: Post-op Vital signs reviewed, Patient's Cardiovascular Status Stable, Respiratory Function Stable, Patent Airway, No signs of Nausea or vomiting and Pain level controlled  Post-op Vital Signs: Reviewed and stable  Complications: No apparent anesthesia complications

## 2013-03-09 NOTE — Progress Notes (Signed)
The History and Physical is unchanged. I have examined the patient. The patient is medically able to have surgery on the right wrist . Derenda Giddings  

## 2013-03-09 NOTE — Transfer of Care (Signed)
Immediate Anesthesia Transfer of Care Note  Patient: Shane CarawayChristopher M Marhefka  Procedure(s) Performed: Procedure(s): RIGHT CARPAL TUNNEL RELEASE OPEN (Right)  Patient Location: PACU  Anesthesia Type:Bier block  Level of Consciousness: awake, alert , oriented and patient cooperative  Airway & Oxygen Therapy: Patient Spontanous Breathing and Patient connected to face mask oxygen  Post-op Assessment: Report given to PACU RN and Post -op Vital signs reviewed and stable  Post vital signs: Reviewed and stable  Complications: No apparent anesthesia complications

## 2013-03-09 NOTE — Anesthesia Procedure Notes (Signed)
Anesthesia Regional Block:  Bier block (IV Regional)  Pre-Anesthetic Checklist: ,, timeout performed, Correct Patient, Correct Site, Correct Laterality, Correct Procedure,, site marked, surgical consent,, at surgeon's request Needles:  Injection technique: Single-shot  Needle Type: Other      Needle Gauge: 22 and 22 G    Additional Needles: Bier block (IV Regional)  Nerve Stimulator or Paresthesia:   Additional Responses:  Pulse checked post tourniquet inflation. IV NSL discontinued post injection. Narrative:  Start time: 03/09/2013 7:44 AM  Performed by: Personally

## 2013-03-09 NOTE — Brief Op Note (Signed)
03/09/2013  8:13 AM  PATIENT:  Shane Crawford  28 y.o. male  PRE-OPERATIVE DIAGNOSIS:  right carpal tunnel syndrome  POST-OPERATIVE DIAGNOSIS:  right carpal tunnel syndrome  PROCEDURE:  Procedure(s): RIGHT CARPAL TUNNEL RELEASE OPEN (Right)  SURGEON:  Surgeon(s) and Role:    * Darreld McleanWayne Zanylah Hardie, MD - Primary  PHYSICIAN ASSISTANT:   ASSISTANTS: none   ANESTHESIA:   regional  EBL:  Total I/O In: 500 [I.V.:500] Out: -   BLOOD ADMINISTERED:none  DRAINS: none   LOCAL MEDICATIONS USED:  NONE  SPECIMEN:  Source of Specimen:  right volar carpal ligament  DISPOSITION OF SPECIMEN:  PATHOLOGY  COUNTS:  YES  TOURNIQUET:   Total Tourniquet Time Documented: Upper Arm (Right) - 31 minutes Total: Upper Arm (Right) - 31 minutes   DICTATION: .Other Dictation: Dictation Number [id number]  PLAN OF CARE: Discharge to home after PACU  PATIENT DISPOSITION:  PACU - hemodynamically stable.   Delay start of Pharmacological VTE agent (>24hrs) due to surgical blood loss or risk of bleeding: not applicable

## 2013-03-09 NOTE — Preoperative (Signed)
Beta Blockers   Reason not to administer Beta Blockers:Not Applicable 

## 2013-03-11 ENCOUNTER — Encounter (HOSPITAL_COMMUNITY): Payer: Self-pay | Admitting: Orthopaedic Surgery

## 2013-11-17 ENCOUNTER — Encounter (HOSPITAL_COMMUNITY): Payer: Self-pay | Admitting: Pharmacy Technician

## 2013-11-24 NOTE — Patient Instructions (Signed)
Cari CarawayChristopher M Hagner  11/24/2013   Your procedure is scheduled on:  11/30/2013  [facility].  Call this number if you have problems the morning of surgery: [phone]   Remember:   Do not eat food or drink liquids after midnight.   Take these medicines the morning of surgery with A SIP OF WATER:  Oxycodone   Do not wear jewelry, make-up or nail polish.  Do not wear lotions, powders, or perfumes. You may wear deodorant.  Do not shave 48 hours prior to surgery. Men may shave face and neck.  Do not bring valuables to the hospital.  [facility] is not [facility] any belongings or valuables.               Contacts, dentures or bridgework may not be worn into surgery.  Leave suitcase in the car. After surgery it may be brought to your room.  For patients admitted to the hospital, discharge time is determined by your treatment team.               Patients discharged the day of surgery will not be allowed to drive home.  Name and phone number of your driver:   Special Instructions: Shower using CHG 2 nights before surgery and the night before surgery.  If you shower the day of surgery use CHG.  Use special wash - you have one bottle of CHG for all showers.  You should use approximately 1/3 of the bottle for each shower.   Please read over the following fact sheets that you were given: Surgical Site Infection Prevention and Anesthesia Post-op Instructions   PATIENT INSTRUCTIONS POST-ANESTHESIA  IMMEDIATELY FOLLOWING SURGERY:  Do not drive or operate machinery for the first twenty four hours after surgery.  Do not make any important decisions for twenty four hours after surgery or while taking narcotic pain medications or sedatives.  If you develop intractable nausea and vomiting or a severe headache please notify your doctor immediately.  FOLLOW-UP:  Please make an appointment with your surgeon as instructed. You do not need to follow up with anesthesia unless  specifically instructed to do so.  WOUND CARE INSTRUCTIONS (if applicable):  Keep a dry clean dressing on the anesthesia/puncture wound site if there is drainage.  Once the wound has quit draining you may leave it open to air.  Generally you should leave the bandage intact for twenty four hours unless there is drainage.  If the epidural site drains for more than 36-48 hours please call the anesthesia department.  QUESTIONS?:  Please feel free to call your physician or the hospital operator if you have any questions, and they will be happy to assist you.      Carpal Tunnel Release Carpal tunnel release is done to relieve the pressure on the nerves and tendons on the bottom side of your wrist.  LET YOUR CAREGIVER KNOW ABOUT:   Allergies to food or medicine.  Medicines taken, including vitamins, herbs, eyedrops, over-the-counter medicines, and creams.  Use of steroids (by mouth or creams).  Previous problems with anesthetics or numbing medicines.  History of bleeding problems or blood clots.  Previous surgery.  Other health problems, including diabetes and kidney problems.  Possibility of pregnancy, if this applies. RISKS AND COMPLICATIONS  Some problems that may happen after this procedure include:  Infection.  Damage to the nerves, arteries or tendons could occur. This would be very uncommon.  Bleeding. BEFORE THE PROCEDURE  This surgery may be done while you are asleep (general anesthetic) or may be done under a block where only your forearm and the surgical area is numb.  If the surgery is done under a block, the numbness will gradually wear off within several hours after surgery. HOME CARE INSTRUCTIONS   Have a responsible person with you for 24 hours.  Do not drive a car or use public transportation for 24 hours.  Only take over-the-counter or prescription medicines for pain, discomfort, or fever as directed by your caregiver. Take them as directed.  You may put ice  on the palm side of the affected wrist.  Put ice in a plastic bag.  Place a towel between your skin and the bag.  Leave the ice on for 20 to 30 minutes, 4 times per day.  If you were given a splint to keep your wrist from bending, use it as directed. It is important to wear the splint at night or as directed. Use the splint for as long as you have pain or numbness in your hand, arm, or wrist. This may take 1 to 2 months.  Keep your hand raised (elevated) above the level of your heart as much as possible. This keeps swelling down and helps with discomfort.  Change bandages (dressings) as directed.  Keep the wound clean and dry. SEEK MEDICAL CARE IF:   You develop pain not relieved with medications.  You develop numbness of your hand.  You develop bleeding from your surgical site.  You have an oral temperature above 102 F (38.9 C).  You develop redness or swelling of the surgical site.  You develop new, unexplained problems. SEEK IMMEDIATE MEDICAL CARE IF:   You develop a rash.  You have difficulty breathing.  You develop any reaction or side effects to medications given. Document Released: 05/04/2003 Document Revised: 05/06/2011 Document Reviewed: 12/18/2006 [facility] Patient Information 2015 [place], [place]. This information is not intended to replace advice given to you by your health care provider. Make sure you discuss any questions you have with your health care provider.

## 2013-11-26 ENCOUNTER — Encounter (HOSPITAL_COMMUNITY)
Admission: RE | Admit: 2013-11-26 | Discharge: 2013-11-26 | Disposition: A | Discharge status: Home / Self Care | Payer: BC Managed Care – PPO | Source: Ambulatory Visit | Attending: Orthopaedic Surgery | Admitting: Orthopaedic Surgery

## 2013-11-26 ENCOUNTER — Encounter (HOSPITAL_COMMUNITY): Payer: Self-pay

## 2013-11-26 DIAGNOSIS — G5602 Carpal tunnel syndrome, left upper limb: Secondary | ICD-10-CM | POA: Diagnosis present

## 2013-11-26 DIAGNOSIS — Z01812 Encounter for preprocedural laboratory examination: Secondary | ICD-10-CM | POA: Insufficient documentation

## 2013-11-26 LAB — CBC
HCT: 42.3 % (ref 39.0–52.0)
Hemoglobin: 15.2 g/dL (ref 13.0–17.0)
MCH: 30.6 pg (ref 26.0–34.0)
MCHC: 35.9 g/dL (ref 30.0–36.0)
MCV: 85.3 fL (ref 78.0–100.0)
PLATELETS: 210 10*3/uL (ref 150–400)
RBC: 4.96 MIL/uL (ref 4.22–5.81)
RDW: 12.3 % (ref 11.5–15.5)
WBC: 7.5 10*3/uL (ref 4.0–10.5)

## 2013-11-26 LAB — URINALYSIS, ROUTINE W REFLEX MICROSCOPIC
BILIRUBIN URINE: NEGATIVE
Glucose, UA: NEGATIVE mg/dL
Hgb urine dipstick: NEGATIVE
Ketones, ur: NEGATIVE mg/dL
Leukocytes, UA: NEGATIVE
NITRITE: NEGATIVE
Protein, ur: NEGATIVE mg/dL
Specific Gravity, Urine: >1.03 — ABNORMAL HIGH (ref 1.005–1.030)
UROBILINOGEN UA: 0.2 mg/dL (ref 0.0–1.0)
pH: 5 (ref 5.0–8.0)

## 2013-11-26 LAB — BASIC METABOLIC PANEL
ANION GAP: 13 (ref 5–15)
BUN: 10 mg/dL (ref 6–23)
CO2: 27 mEq/L (ref 19–32)
CREATININE: 0.89 mg/dL (ref 0.50–1.35)
Calcium: 9.3 mg/dL (ref 8.4–10.5)
Chloride: 103 mEq/L (ref 96–112)
GFR calc non Af Amer: >90 mL/min (ref >90)
Glucose, Bld: 75 mg/dL (ref 70–99)
POTASSIUM: 4.2 meq/L (ref 3.7–5.3)
Sodium: 143 mEq/L (ref 137–147)

## 2013-11-29 NOTE — H&P (Signed)
Shane Crawford is an 28 y.o. male.   Chief Complaint: Carpal tunnel left  HPI: He has had carpal tunnel syndrome for about two years.  He has had positive EMG and PNCV studies showing bilateral carpal tunnel syndrome.  He had surgical release of the right wrist in January of this year.  He did well.  Now he wants to proceed and do the left wrist as he has not improved with conservative therapy, splints or meds.  He understands the procedure the risks and imponderables.  He knows this will be an outpatient procedure.    Past Medical History  Diagnosis Date  . Migraines     Past Surgical History  Procedure Laterality Date  . Laparoscopy  10/08/2010    Procedure: LAPAROSCOPY DIAGNOSTIC;  Surgeon: Fabio Bering;  Location: [facility];  Service: General;  Laterality: N/A;  . Laparoscopic appendectomy  10/08/2010    Procedure: APPENDECTOMY LAPAROSCOPIC;  Surgeon: Fabio Bering;  Location: [facility];  Service: General;  Laterality: N/A;  . Colonoscopy  10/23/2010    Procedure: COLONOSCOPY;  Surgeon: Dalia Heading;  Location: [facility];  Service: Gastroenterology;  Laterality: N/A;  . Appendectomy  09/2010  . Cholecystectomy  11/07/2010    Procedure: LAPAROSCOPIC CHOLECYSTECTOMY;  Surgeon: Fabio Bering;  Location: [facility];  Service: General;  Laterality: N/A;  . Carpal tunnel release Right 03/09/2013    Procedure: RIGHT CARPAL TUNNEL RELEASE OPEN;  Surgeon: Darreld Mclean, MD;  Location: [facility];  Service: Orthopedics;  Laterality: Right;    Family History  Problem Relation Age of Onset  . Anesthesia problems Neg Hx   . Hypotension Neg Hx   . Malignant hyperthermia Neg Hx   . Pseudochol deficiency Neg Hx   . Cancer Mother   . Diabetes Mother   . Heart failure Mother   . Cancer Father   . Diabetes Father    Social History:  reports that he has been smoking Cigarettes.  He has a 7 pack-year smoking history. He does not have any smokeless tobacco history on file. He reports that he drinks  alcohol. He reports that he does not use illicit drugs.  Allergies: No Known Allergies  No prescriptions prior to admission    No results found for this or any previous visit (from the past 48 hour(s)). No results found.  Review of Systems  Musculoskeletal: Positive for joint pain (Pain left wrist.  History of carpal tunnel.  Had right carpal tunnel release in January of this year.).  All other systems reviewed and are negative.   There were no vitals taken for this visit. Physical Exam  Constitutional: He is oriented to person, place, and time. He appears well-developed and well-nourished.  HENT:  Head: Normocephalic and atraumatic.  Eyes: Conjunctivae and EOM are normal. Pupils are equal, round, and reactive to light.  Neck: Normal range of motion. Neck supple.  Cardiovascular: Normal rate, regular rhythm, normal heart sounds and intact distal pulses.   Respiratory: Effort normal and breath sounds normal.  GI: Soft.  Musculoskeletal: He exhibits tenderness (Pain left wrist over volar side.  Positive Tinel and Phalen signs.  Decreased sensation of the left median nerve.  Scar right wrist with normal NV status.).  Neurological: He is alert and oriented to person, place, and time. He has normal reflexes.  Skin: Skin is dry.  Psychiatric: He has a normal mood and affect. His behavior is normal. Judgment and thought content normal.     Assessment/Plan  Carpal tunnel syndrome left, for surgical release and outpatient procedure.  Latorria Zeoli 11/29/2013, 3:09 PM

## 2013-11-30 ENCOUNTER — Ambulatory Visit (HOSPITAL_COMMUNITY): Payer: BC Managed Care – PPO | Admitting: Anesthesiology

## 2013-11-30 ENCOUNTER — Encounter (HOSPITAL_COMMUNITY): Payer: Self-pay | Admitting: *Deleted

## 2013-11-30 ENCOUNTER — Ambulatory Visit (HOSPITAL_COMMUNITY)
Admission: RE | Admit: 2013-11-30 | Discharge: 2013-11-30 | Disposition: A | Discharge status: Home / Self Care | Payer: BC Managed Care – PPO | Source: Ambulatory Visit | Attending: Orthopaedic Surgery | Admitting: Orthopaedic Surgery

## 2013-11-30 ENCOUNTER — Encounter (HOSPITAL_COMMUNITY)
Admission: RE | Disposition: A | Discharge status: Home / Self Care | Payer: Self-pay | Source: Ambulatory Visit | Attending: Orthopaedic Surgery

## 2013-11-30 ENCOUNTER — Encounter (HOSPITAL_COMMUNITY): Payer: BC Managed Care – PPO | Admitting: Anesthesiology

## 2013-11-30 DIAGNOSIS — G5602 Carpal tunnel syndrome, left upper limb: Secondary | ICD-10-CM | POA: Diagnosis not present

## 2013-11-30 DIAGNOSIS — F17210 Nicotine dependence, cigarettes, uncomplicated: Secondary | ICD-10-CM | POA: Diagnosis not present

## 2013-11-30 HISTORY — PX: CARPAL TUNNEL RELEASE: SHX101

## 2013-11-30 SURGERY — CARPAL TUNNEL RELEASE
Anesthesia: Monitor Anesthesia Care | Site: Hand | Laterality: Left

## 2013-11-30 MED ORDER — SODIUM CHLORIDE 0.9 % IJ SOLN
INTRAMUSCULAR | Status: AC
  Filled 2013-11-30: qty 10

## 2013-11-30 MED ORDER — LIDOCAINE HCL (PF) 0.5 % IJ SOLN
INTRAMUSCULAR | Status: AC
  Filled 2013-11-30: qty 50

## 2013-11-30 MED ORDER — LIDOCAINE HCL (PF) 1 % IJ SOLN
INTRAMUSCULAR | Status: AC
  Filled 2013-11-30: qty 5

## 2013-11-30 MED ORDER — MIDAZOLAM HCL 2 MG/2ML IJ SOLN
1.0000 mg | INTRAMUSCULAR | Status: DC | PRN
  Administered 2013-11-30: 2 mg via INTRAVENOUS

## 2013-11-30 MED ORDER — PROPOFOL 10 MG/ML IV BOLUS
INTRAVENOUS | Status: AC
  Filled 2013-11-30: qty 20

## 2013-11-30 MED ORDER — LIDOCAINE HCL (CARDIAC) 10 MG/ML IV SOLN
INTRAVENOUS | Status: DC | PRN
  Administered 2013-11-30: 20 mg via INTRAVENOUS

## 2013-11-30 MED ORDER — SODIUM CHLORIDE 0.9 % IJ SOLN
INTRAMUSCULAR | Status: DC | PRN
  Administered 2013-11-30: .5 mL

## 2013-11-30 MED ORDER — FENTANYL CITRATE 0.05 MG/ML IJ SOLN
25.0000 ug | INTRAMUSCULAR | Status: AC
  Administered 2013-11-30 (×2): 25 ug via INTRAVENOUS

## 2013-11-30 MED ORDER — MIDAZOLAM HCL 2 MG/2ML IJ SOLN
INTRAMUSCULAR | Status: AC
  Filled 2013-11-30: qty 2

## 2013-11-30 MED ORDER — SODIUM CHLORIDE 0.9 % IR SOLN
Status: DC | PRN
  Administered 2013-11-30: 1000 mL

## 2013-11-30 MED ORDER — ONDANSETRON HCL 4 MG/2ML IJ SOLN
4.0000 mg | Freq: Once | INTRAMUSCULAR | Status: DC | PRN
Start: 2013-11-30 — End: 2013-11-30

## 2013-11-30 MED ORDER — FENTANYL CITRATE 0.05 MG/ML IJ SOLN
INTRAMUSCULAR | Status: AC
  Filled 2013-11-30: qty 2

## 2013-11-30 MED ORDER — FENTANYL CITRATE 0.05 MG/ML IJ SOLN
INTRAMUSCULAR | Status: AC
  Filled 2013-11-30: qty 5

## 2013-11-30 MED ORDER — PROPOFOL INFUSION 10 MG/ML OPTIME
INTRAVENOUS | Status: DC | PRN
  Administered 2013-11-30: 75 ug/kg/min via INTRAVENOUS
  Administered 2013-11-30: 100 ug/kg/min via INTRAVENOUS

## 2013-11-30 MED ORDER — FENTANYL CITRATE 0.05 MG/ML IJ SOLN
25.0000 ug | INTRAMUSCULAR | Status: DC | PRN
  Administered 2013-11-30 (×2): 50 ug via INTRAVENOUS
  Filled 2013-11-30: qty 2

## 2013-11-30 MED ORDER — FENTANYL CITRATE 0.05 MG/ML IJ SOLN
25.0000 ug | INTRAMUSCULAR | Status: AC | PRN
  Administered 2013-11-30 (×2): 50 ug via INTRAVENOUS

## 2013-11-30 MED ORDER — FENTANYL CITRATE 0.05 MG/ML IJ SOLN
INTRAMUSCULAR | Status: DC | PRN
  Administered 2013-11-30 (×2): 50 ug via INTRAVENOUS
  Administered 2013-11-30: 100 ug via INTRAVENOUS
  Administered 2013-11-30: 50 ug via INTRAVENOUS

## 2013-11-30 MED ORDER — LACTATED RINGERS IV SOLN
INTRAVENOUS | Status: DC
  Administered 2013-11-30: 1000 mL via INTRAVENOUS

## 2013-11-30 MED ORDER — MIDAZOLAM HCL 5 MG/5ML IJ SOLN
INTRAMUSCULAR | Status: DC | PRN
  Administered 2013-11-30 (×2): 1 mg via INTRAVENOUS

## 2013-11-30 SURGICAL SUPPLY — 42 items
BAG HAMPER (MISCELLANEOUS) ×3 IMPLANT
BANDAGE ELASTIC 3 VELCRO NS (GAUZE/BANDAGES/DRESSINGS) ×3 IMPLANT
BANDAGE ESMARK 4X12 BL STRL LF (DISPOSABLE) ×1 IMPLANT
BLADE 15 SAFETY STRL DISP (BLADE) ×3 IMPLANT
BNDG CMPR 12X4 ELC STRL LF (DISPOSABLE) ×1
BNDG ESMARK 4X12 BLUE STRL LF (DISPOSABLE) ×3
CLOTH BEACON ORANGE TIMEOUT ST (SAFETY) ×3 IMPLANT
COVER LIGHT HANDLE STERIS (MISCELLANEOUS) ×6 IMPLANT
CUFF TOURNIQUET SINGLE 18IN (TOURNIQUET CUFF) ×1 IMPLANT
CUFF TOURNIQUET SINGLE 24IN (TOURNIQUET CUFF) ×2 IMPLANT
DRSG XEROFORM 1X8 (GAUZE/BANDAGES/DRESSINGS) ×4 IMPLANT
DURAPREP 26ML APPLICATOR (WOUND CARE) ×3 IMPLANT
ELECT NDL TIP 2.8 STRL (NEEDLE) IMPLANT
ELECT NEEDLE TIP 2.8 STRL (NEEDLE) ×3 IMPLANT
ELECT REM PT RETURN 9FT ADLT (ELECTROSURGICAL) ×3
ELECTRODE REM PT RTRN 9FT ADLT (ELECTROSURGICAL) ×1 IMPLANT
FORMALIN 10 PREFIL 120ML (MISCELLANEOUS) ×3 IMPLANT
GAUZE SPONGE 4X4 12PLY STRL (GAUZE/BANDAGES/DRESSINGS) ×3 IMPLANT
GLOVE BIO SURGEON STRL SZ8 (GLOVE) ×3 IMPLANT
GLOVE BIO SURGEON STRL SZ8.5 (GLOVE) ×3 IMPLANT
GLOVE BIOGEL M 7.0 STRL (GLOVE) ×2 IMPLANT
GLOVE BIOGEL PI IND STRL 7.0 (GLOVE) IMPLANT
GLOVE BIOGEL PI INDICATOR 7.0 (GLOVE) ×2
GOWN STRL REUS W/TWL LRG LVL3 (GOWN DISPOSABLE) ×6 IMPLANT
GOWN STRL REUS W/TWL XL LVL3 (GOWN DISPOSABLE) ×3 IMPLANT
KIT ROOM TURNOVER APOR (KITS) ×3 IMPLANT
NDL HYPO 18GX1.5 BLUNT FILL (NEEDLE) ×1 IMPLANT
NDL HYPO 27GX1-1/4 (NEEDLE) ×1 IMPLANT
NEEDLE HYPO 18GX1.5 BLUNT FILL (NEEDLE) ×3 IMPLANT
NEEDLE HYPO 27GX1-1/4 (NEEDLE) ×3 IMPLANT
NS IRRIG 1000ML POUR BTL (IV SOLUTION) ×3 IMPLANT
PACK BASIC LIMB (CUSTOM PROCEDURE TRAY) ×3 IMPLANT
PAD ARMBOARD 7.5X6 YLW CONV (MISCELLANEOUS) ×3 IMPLANT
PAD CAST 3X4 CTTN HI CHSV (CAST SUPPLIES) ×1 IMPLANT
PADDING CAST COTTON 3X4 STRL (CAST SUPPLIES) ×3
PADDING WEBRIL 3 STERILE (GAUZE/BANDAGES/DRESSINGS) ×2 IMPLANT
SET BASIN LINEN APH (SET/KITS/TRAYS/PACK) ×3 IMPLANT
SPONGE GAUZE 4X4 12PLY (GAUZE/BANDAGES/DRESSINGS) ×1 IMPLANT
SUT ETHILON 3 0 FSL (SUTURE) ×3 IMPLANT
SYR 3ML LL SCALE MARK (SYRINGE) ×3 IMPLANT
TOWEL OR 17X26 4PK STRL BLUE (TOWEL DISPOSABLE) ×3 IMPLANT
VESSEL LOOPS MAXI RED (MISCELLANEOUS) ×3 IMPLANT

## 2013-11-30 NOTE — Anesthesia Preprocedure Evaluation (Signed)
Anesthesia Evaluation  Patient identified by MRN, date of birth, ID band Patient awake    Reviewed: Allergy & Precautions, H&P , NPO status , Patient's Chart, lab work & pertinent test results  History of Anesthesia Complications Negative for: history of anesthetic complications  Airway Mallampati: I TM Distance: >3 FB     Dental  (+) Teeth Intact   Pulmonary neg pulmonary ROS, Current Smoker,  breath sounds clear to auscultation        Cardiovascular negative cardio ROS  Rhythm:Regular Rate:Normal     Neuro/Psych  Headaches,    GI/Hepatic   Endo/Other    Renal/GU      Musculoskeletal   Abdominal (+) - obese, scaphoid   Peds  Hematology   Anesthesia Other Findings   Reproductive/Obstetrics                           Anesthesia Physical Anesthesia Plan  ASA: II  Anesthesia Plan: Bier Block and MAC   Post-op Pain Management:    Induction: Intravenous  Airway Management Planned: Nasal Cannula  Additional Equipment:   Intra-op Plan:   Post-operative Plan:   Informed Consent: I have reviewed the patients History and Physical, chart, labs and discussed the procedure including the risks, benefits and alternatives for the proposed anesthesia with the patient or authorized representative who has indicated his/her understanding and acceptance.     Plan Discussed with:   Anesthesia Plan Comments:         Anesthesia Quick Evaluation

## 2013-11-30 NOTE — Transfer of Care (Signed)
Immediate Anesthesia Transfer of Care Note  Patient: Shane CarawayChristopher M Tubbs  Procedure(s) Performed: Procedure(s): CARPAL TUNNEL RELEASE (Left)  Patient Location: PACU  Anesthesia Type:Regional/ MAC and Bier Block  Level of Consciousness: awake and patient cooperative  Airway & Oxygen Therapy: Patient Spontanous Breathing and Patient connected to nasal cannula oxygen  Post-op Assessment: Report given to PACU RN, Post -op Vital signs reviewed and stable and Patient moving all extremities  Post vital signs: Reviewed and stable  Complications: No apparent anesthesia complications

## 2013-11-30 NOTE — Progress Notes (Signed)
The History and Physical is unchanged. I have examined the patient. The patient is medically able to have surgery on the left volar wrist . [place]  

## 2013-11-30 NOTE — Anesthesia Postprocedure Evaluation (Signed)
Anesthesia Post Note  Patient: Cari CarawayChristopher M Shilling  Procedure(s) Performed: Procedure(s) (LRB): CARPAL TUNNEL RELEASE (Left)  Anesthesia type: MAC  Patient location: PACU  Post pain: Pain level controlled  Post assessment: Post-op Vital signs reviewed, Patient's Cardiovascular Status Stable, Respiratory Function Stable, Patent Airway, No signs of Nausea or vomiting and Pain level controlled  Last Vitals:  Filed Vitals:   11/30/13 0854  BP: 136/93  Pulse: 67  Temp:   Resp: 16    Post vital signs: Reviewed and stable  Level of consciousness: awake and alert   Complications: No apparent anesthesia complications

## 2013-11-30 NOTE — Brief Op Note (Signed)
11/30/2013  8:11 AM  PATIENT:  Cari Carawayhristopher M Mendiola  28 y.o. male  PRE-OPERATIVE DIAGNOSIS:  Carpal Tunnel Left  POST-OPERATIVE DIAGNOSIS:  Carpal Tunnel Left  PROCEDURE:  Procedure(s): CARPAL TUNNEL RELEASE (Left)  SURGEON:  Surgeon(s) and Role:    * Darreld McleanWayne Ferdinand Revoir, MD - Primary  PHYSICIAN ASSISTANT:   ASSISTANTS: none   ANESTHESIA:   regional  EBL:  Total I/O In: 600 [I.V.:600] Out: 0   BLOOD ADMINISTERED:none  DRAINS: none   LOCAL MEDICATIONS USED:  NONE  SPECIMEN:  Source of Specimen:  left volar carpal ligament  DISPOSITION OF SPECIMEN:  PATHOLOGY  COUNTS:  YES  TOURNIQUET:   Total Tourniquet Time Documented: Upper Arm (Left) - 24 minutes Total: Upper Arm (Left) - 24 minutes   DICTATION: .Other Dictation: Dictation Number [id number]  PLAN OF CARE: Discharge to home after PACU  PATIENT DISPOSITION:  PACU - hemodynamically stable.   Delay start of Pharmacological VTE agent (>24hrs) due to surgical blood loss or risk of bleeding: not applicable

## 2013-11-30 NOTE — Discharge Instructions (Signed)
Keep wound and dressing dry and intact.  Elevate left arm as needed.  Keep appointment with Dr. Hilda LiasKeeling in one week.  Call his office at [phone] if any problem.  If after hours, call the hospital at [phone].  Carpal Tunnel Release (Repair), Care After Refer to this sheet in the next few weeks. These discharge instructions provide you with general information on caring for yourself after you leave the hospital. Your caregiver may also give you specific instructions. Your treatment has been planned according to the most current medical practices available, but unavoidable complications sometimes occur. If you have any problems or questions after discharge, please call your caregiver. HOME CARE INSTRUCTIONS   Have a responsible person with you for 24 hours.  Do not drive a car or take public transportation for 24 hours.  Only take over-the-counter or prescription medicines for pain, discomfort, or fever as directed by your caregiver. Take them as directed.  You may put ice on the palm side of the affected wrist.  Put ice in a plastic bag.  Place a towel between your skin and the bag.  Leave the ice on for 15-20 minutes, 03-04 times per day.  If you were given a splint to keep your wrist from bending, use it as directed. It is important to wear the splint at night or as directed. Use the splint for as long as you have pain or numbness in your hand, arm, or wrist. This may take 1 to 2 months.  Keep your hand raised (elevated) above the level of your heart as much as possible. This keeps swelling down and helps with discomfort.  Change bandages (dressings) as directed.  Keep the wound clean and dry. SEEK MEDICAL CARE IF:   You develop pain not relieved with medicines.  You develop numbness of your hand.  You develop bleeding from your surgical site.  You have an oral temperature above 102 F (38.9 C).  You develop redness or swelling of the surgical site.  You develop new,  unexplained problems. SEEK IMMEDIATE MEDICAL CARE IF:   You develop a rash.  You have difficulty breathing.  You develop any reaction or side effects to medicines given. MAKE SURE YOU:   Understand these instructions.  Will watch your condition.  Will get help right away if you are not doing well or get worse. Document Released: 08/31/2004 Document Revised: 12/02/2012 Document Reviewed: 12/18/2006 [facility] Patient Information 2015 [place]. This information is not intended to replace advice given to you by your health care provider. Make sure you discuss any questions you have with your health care provider.

## 2013-11-30 NOTE — Anesthesia Procedure Notes (Signed)
Procedure Name: MAC Date/Time: 11/30/2013 7:25 AM Performed by: Franco NonesYATES, Harshitha Fretz S Pre-anesthesia Checklist: Patient identified, Emergency Drugs available, Suction available, Timeout performed and Patient being monitored Patient Re-evaluated:Patient Re-evaluated prior to inductionOxygen Delivery Method: Nasal Cannula    Procedure Name: MAC Date/Time: 11/30/2013 7:40 AM Performed by: Franco NonesYATES, Vannary Greening S Pre-anesthesia Checklist: Patient identified, Emergency Drugs available, Suction available, Timeout performed and Patient being monitored Patient Re-evaluated:Patient Re-evaluated prior to inductionOxygen Delivery Method: Non-rebreather mask    Anesthesia Regional Block:  Bier block (IV Regional)  Pre-Anesthetic Checklist: ,, timeout performed, Correct Patient, Correct Site, Correct Laterality, Correct Procedure,, site marked, surgical consent,, at surgeon's request  Laterality: Left     Needles:  Injection technique: Single-shot  Needle Type: Other      Needle Gauge: 22 and 22 G    Additional Needles: Bier block (IV Regional)  Nerve Stimulator or Paresthesia:   Additional Responses:  Pulse checked post tourniquet inflation. IV NSL discontinued post injection. Narrative:  Start time: 11/30/2013 7:33 AM End time: 11/30/2013 7:36 AM  Performed by: Personally

## 2013-11-30 NOTE — Op Note (Signed)
NAME:  Myra RudeSMITH, Mattie           ACCOUNT NO.:  [id number]  MEDICAL RECORD NO.:  [id number]  LOCATION:  [facility]                          FACILITY:  [facility]  PHYSICIAN:  J. Darreld McleanWayne Sara Keys, M.D. DATE OF BIRTH:  10-12-1985  DATE OF PROCEDURE: DATE OF DISCHARGE:                              OPERATIVE REPORT   PREOPERATIVE DIAGNOSIS:  Carpal tunnel syndrome on the left.  POSTOPERATIVE DIAGNOSIS:  Carpal tunnel syndrome on the left.  PROCEDURE:  Release of volar carpal ligament, saline neurolysis, epineurotomy, left median nerve.  ANESTHESIA:  Regional Bier block.  TOURNIQUET TIME:  24 minutes.  DRAINS:  No drains.  SPECIMENS SENT TO PATHOLOGY:  Volar carpal ligament.  SURGEON:  J. Darreld McleanWayne Tyleigh Mahn, MD  INDICATIONS:  The patient is a 28 year old male with documented carpal tunnel syndrome bilaterally.  He has had EMGs and nerve conductions in the past showing this.  He underwent right carpal tunnel release in January of this year and did well and would like to have surgery now for a left carpal tunnel release.  He has not improved with conservative treatment, splinting, and medications.  He has done well from the right side and elected the left side now.  He understands the risks and imponderables of the procedure having undergone the procedure in the past.  He understands this as an outpatient procedure and since sutures will stay in for approximately 2 weeks.  DESCRIPTION OF PROCEDURE:  The patient was seen in the holding area and the left wrist was identified as correct surgical site, and I placed a mark over the left volar wrist area.  He was brought to the operating room, placed supine on the operating room table with the left hand table attached.  He was given regional Bier block anesthesia.  He was then prepped and draped in the usual manner.  I had a generalized time-out identifying the patient as Mr. Katrinka BlazingSmith, and his left wrist for carpal tunnel release on the volar side.   All instrumentation properly positioned and working.  The OR team knew each other.  Outline was made for an incision on the volar wrist on the left side. Careful dissection of the median nerve was identified proximally and a vessel loop placed around the nerve.  A groove director was then inserted and then the volar carpal ligament was incised.  The specimen was sent to pathology.  The nerve was obviously compressed.  Saline neurolysis epineurotomy carried out without any apparent injury to the nerve.  Retinaculum cut proximally.  The wounds were then reapproximated using 3-0 nylon interrupted vertical mattress manner.  Sterile dressing applied, bulky dressing applied, and sheet cotton applied.  Sheet cotton cut dorsally.  ACE bandage applied loosely.  The patient tolerated the procedure well.  Given appropriate analgesia for pain.  I will see him in the office in 1 week.  If any difficulties, he is to contact me through the office hospital beeper system.          ______________________________ Shela CommonsJ. Darreld McleanWayne Ojani Berenson, M.D.     JWK/MEDQ  D:  11/30/2013  T:  11/30/2013  Job:  [id number]

## 2013-12-02 ENCOUNTER — Encounter (HOSPITAL_COMMUNITY): Payer: Self-pay | Admitting: Orthopaedic Surgery

## 2014-07-04 ENCOUNTER — Encounter (HOSPITAL_COMMUNITY): Payer: Self-pay | Admitting: Intensive Care

## 2014-07-04 ENCOUNTER — Emergency Department (HOSPITAL_COMMUNITY)
Admission: EM | Admit: 2014-07-04 | Discharge: 2014-07-04 | Disposition: A | Discharge status: Home / Self Care | Payer: BLUE CROSS/BLUE SHIELD | Attending: Emergency Medicine | Admitting: Emergency Medicine

## 2014-07-04 ENCOUNTER — Emergency Department (HOSPITAL_COMMUNITY): Payer: BLUE CROSS/BLUE SHIELD

## 2014-07-04 DIAGNOSIS — M545 Low back pain: Secondary | ICD-10-CM | POA: Diagnosis present

## 2014-07-04 DIAGNOSIS — Z8679 Personal history of other diseases of the circulatory system: Secondary | ICD-10-CM | POA: Diagnosis not present

## 2014-07-04 DIAGNOSIS — Z720 Tobacco use: Secondary | ICD-10-CM | POA: Diagnosis not present

## 2014-07-04 DIAGNOSIS — M5431 Sciatica, right side: Secondary | ICD-10-CM | POA: Diagnosis not present

## 2014-07-04 DIAGNOSIS — R52 Pain, unspecified: Secondary | ICD-10-CM

## 2014-07-04 MED ORDER — HYDROMORPHONE HCL 1 MG/ML IJ SOLN
1.0000 mg | Freq: Once | INTRAMUSCULAR | Status: AC
  Administered 2014-07-04: 1 mg via INTRAMUSCULAR
  Filled 2014-07-04: qty 1

## 2014-07-04 MED ORDER — OXYCODONE-ACETAMINOPHEN 5-325 MG PO TABS: 1.0000 | ORAL_TABLET | Freq: Four times a day (QID) | ORAL | Status: DC | PRN

## 2014-07-04 MED ORDER — CYCLOBENZAPRINE HCL 10 MG PO TABS: 10.0000 mg | ORAL_TABLET | Freq: Three times a day (TID) | ORAL | Status: DC | PRN

## 2014-07-04 NOTE — ED Notes (Signed)
Pt states lower back pain that radiates down to his right foot. Pt also c/o of pain in his scrotum. Pain started yesterday evening

## 2014-07-04 NOTE — Discharge Instructions (Signed)
Follow up with your md in 2-3 days for recheck °

## 2014-07-04 NOTE — ED Provider Notes (Signed)
CSN: [id number]     Arrival date & time 07/04/14  [id number] History  This chart was scribed for Bethann BerkshireJoseph Tinleigh Whitmire, MD by [place] Peace, ED Scribe. The patient was seen in [facility]. The patient's care was started at 8:37 AM.     Chief Complaint  Patient presents with  . Back Pain       Patient is a 29 y.o. male presenting with back pain. The history is provided by the patient. No language interpreter was used.  Back Pain Associated symptoms: no abdominal pain, no chest pain and no headaches   HPI Comments: Shane Crawford is a 29 y.o. male who presents to the Emergency Department complaining of severe lower back pain onset yesterday that started while pt was doing some yardwork outside. He describes pain as "sharp" and states it radiates down into his right foot. Pt explains that he works 3rd shift at his job and went into work after pain started which he thinks probably aggravated his symptoms even more. Pt is current everyday smoker.    Past Medical History  Diagnosis Date  . Migraines    Past Surgical History  Procedure Laterality Date  . Laparoscopy  10/08/2010    Procedure: LAPAROSCOPY DIAGNOSTIC;  Surgeon: Fabio BeringBrent C Ziegler;  Location: [facility];  Service: General;  Laterality: N/A;  . Laparoscopic appendectomy  10/08/2010    Procedure: APPENDECTOMY LAPAROSCOPIC;  Surgeon: Fabio BeringBrent C Ziegler;  Location: [facility];  Service: General;  Laterality: N/A;  . Colonoscopy  10/23/2010    Procedure: COLONOSCOPY;  Surgeon: Dalia HeadingMark A Jenkins;  [facility];  Service: Gastroenterology;  Laterality: N/A;  . Appendectomy  09/2010  . Cholecystectomy  11/07/2010    Procedure: LAPAROSCOPIC CHOLECYSTECTOMY;  Surgeon: Fabio BeringBrent C Ziegler;  Location: [facility];  Service: General;  Laterality: N/A;  . Carpal tunnel release Right 03/09/2013    Procedure: RIGHT CARPAL TUNNEL RELEASE OPEN;  Surgeon: Darreld McleanWayne Keeling, MD;  Location: [facility];  Service: Orthopedics;  Laterality: Right;  . Carpal tunnel release Left 11/30/2013     Procedure: CARPAL TUNNEL RELEASE;  Surgeon: Darreld McleanWayne Keeling, MD;  Location: [facility];  Service: Orthopedics;  Laterality: Left;   Family History  Problem Relation Age of Onset  . Anesthesia problems Neg Hx   . Hypotension Neg Hx   . Malignant hyperthermia Neg Hx   . Pseudochol deficiency Neg Hx   . Cancer Mother   . Diabetes Mother   . Heart failure Mother   . Cancer Father   . Diabetes Father    History  Substance Use Topics  . Smoking status: Current Every Day Smoker -- 1.00 packs/day for 7 years    Types: Cigarettes    Last Attempt to Quit: 10/27/2010  . Smokeless tobacco: Not on file  . Alcohol Use: Yes     Comment: occassional- 1 beer q 3 weeks    Review of Systems  Constitutional: Negative for appetite change and fatigue.  HENT: Negative for congestion, ear discharge and sinus pressure.   Eyes: Negative for discharge.  Respiratory: Negative for cough.   Cardiovascular: Negative for chest pain.  Gastrointestinal: Negative for abdominal pain and diarrhea.  Genitourinary: Negative for frequency and hematuria.  Musculoskeletal: Positive for back pain.  Skin: Negative for rash.  Neurological: Negative for seizures and headaches.  Psychiatric/Behavioral: Negative for hallucinations.      Allergies  Review of patient's allergies indicates no known allergies.  Home Medications   Prior to Admission medications   Medication Sig Start  Date End Date Taking? Authorizing Provider  ibuprofen (ADVIL,MOTRIN) 200 MG tablet Take 600 mg by mouth every 6 (six) hours as needed for moderate pain.    Historical Provider, MD  oxyCODONE-acetaminophen (PERCOCET) 7.5-325 MG per tablet Take 1 tablet by mouth every 4 (four) hours as needed for pain. 03/09/13   Darreld McleanWayne Keeling, MD   BP 137/103 mmHg  Pulse 93  Temp(Src) 97.8 F (36.6 C) (Oral)  Resp 16  Ht 6' (1.829 m)  Wt 282 lb (127.914 kg)  BMI 38.24 kg/m2  SpO2 99% Physical Exam  Constitutional: He is oriented to person, place, and  time. He appears well-developed.  HENT:  Head: Normocephalic.  Eyes: Conjunctivae and EOM are normal. No scleral icterus.  Neck: Neck supple. No thyromegaly present.  Cardiovascular: Normal rate and regular rhythm.  Exam reveals no gallop and no friction rub.   No murmur heard. Pulmonary/Chest: No stridor. He has no wheezes. He has no rales. He exhibits no tenderness.  Abdominal: He exhibits no distension. There is no tenderness. There is no rebound.  Musculoskeletal: Normal range of motion. He exhibits tenderness. He exhibits no edema.  Moderate L Spine tenderness. Positive straight leg raise on the right.   Lymphadenopathy:    He has no cervical adenopathy.  Neurological: He is oriented to person, place, and time. He exhibits normal muscle tone. Coordination normal.  Skin: No rash noted. No erythema.  Psychiatric: He has a normal mood and affect. His behavior is normal.    ED Course  Procedures (including critical care time) Labs Review Labs Reviewed - No data to display  Imaging Review No results found.   EKG Interpretation None     Medications - No data to display  8:40 AM- Treatment plan was discussed with patient who verbalizes understanding and agrees.   MDM   Final diagnoses:  None    Sciatica,  tx with flexeril, percocet and follow up The chart was scribed for me under my direct supervision.  I personally performed the history, physical, and medical decision making and all procedures in the evaluation of this patient.Bethann Berkshire.   Viera Okonski, MD 07/04/14 [phone]

## 2014-08-11 ENCOUNTER — Other Ambulatory Visit (HOSPITAL_COMMUNITY): Payer: Self-pay | Admitting: Orthopaedic Surgery

## 2014-08-11 DIAGNOSIS — M25512 Pain in left shoulder: Secondary | ICD-10-CM

## 2014-08-24 ENCOUNTER — Ambulatory Visit (HOSPITAL_COMMUNITY)
Admission: RE | Admit: 2014-08-24 | Payer: BLUE CROSS/BLUE SHIELD | Source: Ambulatory Visit | Admitting: Orthopaedic Surgery

## 2014-09-23 ENCOUNTER — Ambulatory Visit (HOSPITAL_COMMUNITY): Payer: BLUE CROSS/BLUE SHIELD

## 2014-09-30 ENCOUNTER — Ambulatory Visit (HOSPITAL_COMMUNITY)
Admission: RE | Admit: 2014-09-30 | Discharge: 2014-09-30 | Disposition: A | Discharge status: Home / Self Care | Payer: BLUE CROSS/BLUE SHIELD | Source: Ambulatory Visit | Attending: Orthopaedic Surgery | Admitting: Orthopaedic Surgery

## 2014-09-30 DIAGNOSIS — M25512 Pain in left shoulder: Secondary | ICD-10-CM | POA: Insufficient documentation

## 2015-04-04 ENCOUNTER — Encounter: Payer: Self-pay | Admitting: Orthopaedic Surgery

## 2015-04-04 ENCOUNTER — Ambulatory Visit (INDEPENDENT_AMBULATORY_CARE_PROVIDER_SITE_OTHER): Payer: BLUE CROSS/BLUE SHIELD | Admitting: Orthopaedic Surgery

## 2015-04-04 VITALS — BP 145/93 | HR 86 | Ht 72.0 in | Wt 274.2 lb

## 2015-04-04 DIAGNOSIS — M25512 Pain in left shoulder: Secondary | ICD-10-CM | POA: Diagnosis not present

## 2015-04-04 MED ORDER — OXYCODONE-ACETAMINOPHEN 7.5-325 MG PO TABS: 1.0000 | ORAL_TABLET | ORAL | Status: DC | PRN

## 2015-04-04 NOTE — Patient Instructions (Signed)
Joint Injection  Care After  Refer to this sheet in the next few days. These instructions provide you with information on caring for yourself after you have had a joint injection. Your caregiver also may give you more specific instructions. Your treatment has been planned according to current medical practices, but problems sometimes occur. Call your caregiver if you have any problems or questions after your procedure.  After any type of joint injection, it is not uncommon to experience:  Soreness, swelling, or bruising around the injection site.  Mild numbness, tingling, or weakness around the injection site caused by the numbing medicine used before or with the injection. It also is possible to experience the following effects associated with the specific agent after injection:  Iodine-based contrast agents:  Allergic reaction (itching, hives, widespread redness, and swelling beyond the injection site).  Corticosteroids (These effects are rare.):  Allergic reaction.  Increased blood sugar levels (If you have diabetes and you notice that your blood sugar levels have increased, notify your caregiver).  Increased blood pressure levels.  Mood swings.  Hyaluronic acid in the use of viscosupplementation.  Temporary heat or redness.  Temporary rash and itching.  Increased fluid accumulation in the injected joint. These effects all should resolve within a day after your procedure.  HOME CARE INSTRUCTIONS  Limit yourself to light activity the day of your procedure. Avoid lifting heavy objects, bending, stooping, or twisting.  Take prescription or over-the-counter pain medication as directed by your caregiver.  You may apply ice to your injection site to reduce pain and swelling the day of your procedure. Ice may be applied 3-4 times:  Put ice in a plastic bag.  Place a towel between your skin and the bag.  Leave the ice on for no longer than 15-20 minutes each time. SEEK IMMEDIATE MEDICAL CARE IF:   Pain and swelling get worse rather than better or extend beyond the injection site.  Numbness does not go away.  Blood or fluid continues to leak from the injection site.  You have chest pain.  You have swelling of your face or tongue.  You have trouble breathing or you become dizzy.  You develop a fever, chills, or severe tenderness at the injection site that last longer than 1 day. MAKE SURE YOU:  Understand these instructions.  Watch your condition.  Get help right away if you are not doing well or if you get worse. Document Released: 10/25/2010 Document Revised: 05/06/2011 Document Reviewed: 10/25/2010  [facility] Patient Information 2014 [place], [place].  2

## 2015-04-04 NOTE — Progress Notes (Signed)
Patient Shane Crawford, male DOB:05-May-1985, 30 y.o. [id number]  Chief Complaint  Patient presents with  . Follow-up    Left shoulder pain worse    HPI  Shane Crawford is a 30 y.o. male who has chronic left shoulder pain made worse with the recent cold weather.  He has no new trauma and no paresthesias and no redness.  Shoulder Pain  The pain is present in the left shoulder. This is a chronic problem. The current episode started more than 1 year ago. There has been no history of extremity trauma. The problem occurs daily. The problem has been gradually worsening. The quality of the pain is described as aching and sharp. The pain is at a severity of 5/10. The pain is moderate. The symptoms are aggravated by activity and cold. The treatment provided mild relief.    Review of Systems Review of Systems  Gastrointestinal: Positive for constipation.  Musculoskeletal: Positive for arthralgias (left shoulder only).  All other systems reviewed and are negative.   Past Medical History  Diagnosis Date  . Migraines     Past Surgical History  Procedure Laterality Date  . Laparoscopy  10/08/2010    Procedure: LAPAROSCOPY DIAGNOSTIC;  Surgeon: Fabio Bering;  [facility]: [facility];  Service: General;  Laterality: N/A;  . Laparoscopic appendectomy  10/08/2010    Procedure: APPENDECTOMY LAPAROSCOPIC;  Surgeon: Fabio Bering;  Location: [facility];  Service: General;  Laterality: N/A;  . Colonoscopy  10/23/2010    Procedure: COLONOSCOPY;  Surgeon: Dalia Heading;  [facility];  Service: Gastroenterology;  Laterality: N/A;  . Appendectomy  09/2010  . Cholecystectomy  11/07/2010    Procedure: LAPAROSCOPIC CHOLECYSTECTOMY;  Surgeon: Fabio Bering;  Location: [facility];  Service: General;  Laterality: N/A;  . Carpal tunnel release Right 03/09/2013    Procedure: RIGHT CARPAL TUNNEL RELEASE OPEN;  Surgeon: Darreld Mclean, MD;  Location: [facility];  Service: Orthopedics;  Laterality: Right;   . Carpal tunnel release Left 11/30/2013    Procedure: CARPAL TUNNEL RELEASE;  Surgeon: Darreld Mclean, MD;  Location: [facility];  Service: Orthopedics;  Laterality: Left;    Family History  Problem Relation Age of Onset  . Anesthesia problems Neg Hx   . Hypotension Neg Hx   . Malignant hyperthermia Neg Hx   . Pseudochol deficiency Neg Hx   . Cancer Mother   . Diabetes Mother   . Heart failure Mother   . Cancer Father   . Diabetes Father     Social History Social History  Substance Use Topics  . Smoking status: Current Every Day Smoker -- 1.00 packs/day for 7 years    Types: Cigarettes    Last Attempt to Quit: 10/27/2010  . Smokeless tobacco: None  . Alcohol Use: Yes     Comment: occassional- 1 beer q 3 weeks    No Known Allergies  Current Outpatient Prescriptions  Medication Sig Dispense Refill  . naproxen sodium (ANAPROX) 220 MG tablet Take 440 mg by mouth daily as needed (pain). Reported on 04/04/2015    . cyclobenzaprine (FLEXERIL) 10 MG tablet Take 1 tablet (10 mg total) by mouth 3 (three) times daily as needed for muscle spasms. (Patient not taking: Reported on 04/04/2015) 20 tablet 0  . LORazepam (ATIVAN) 1 MG tablet Take 1 mg by mouth every 8 (eight) hours as needed for anxiety. Reported on 04/04/2015    . oxyCODONE-acetaminophen (PERCOCET) 7.5-325 MG tablet Take 1 tablet by mouth every 4 (four)  hours as needed for moderate pain or severe pain (Must last 30 days.Do not take and drive a car or operate machinery). 120 tablet 0   No current facility-administered medications for this visit.     Physical Exam  Blood pressure 145/93, pulse 86, height 6' (1.829 m), weight 274 lb 3.2 oz (124.376 kg).  Constitutional: overall normal hygiene, normal nutrition, well developed, normal grooming, normal body habitus. Assistive device:none  Musculoskeletal: gait and station Limp none, muscle tone and strength are normal, no tremors or atrophy is present.  .  Neurological:  coordination overall normal.  Deep tendon reflex/nerve stretch intact.  Sensation normal.  Cranial nerves II-XII intact.   Skin:normal overall no scars, lesions, ulcers or rash es. No psoriasis.  Psychiatric: Alert and oriented x 3.  Recent memory intact, remote memory unclear.  Normal mood and affect. Well groomed.  Good eye contact.  Cardiovascular: overall no swelling, no varicosities, no edema bilaterally, normal temperatures of the legs and arms, no clubbing, cyanosis and good capillary refill.  Lymphatic: palpation is normal.  Encounter Diagnosis  Name Primary?  . Pain in joint of left shoulder Yes     Extremities:left shoulder tender with normal color Inspection no effusion, NV intact. Reflexes normal  Grips normal Strength and tone normal Range of motion forward flexion 145, abduction 100, internal rotation 35, external rotation 35, extension 25, adduction 40.  Additional services performed: injected the left shoulder  The patient request injection, verbal consent was obtained.  The left shoulder was prepped appropriately after time out was performed.   Sterile technique was observed and injection of 1 cc of Depo-Medrol 40 mg with several cc's of plain xylocaine. Anesthesia was provided by ethyl chloride and a 20-gauge needle was used to inject the shoulder area. A posterior approach was used.  The injection was tolerated well.  A band aid dressing was applied.  The patient was advised to apply ice later today and tomorrow to the injection sight as needed.  PLAN Call if any problems.  Precautions discussed.  Continue current medications.  Do exercises at home.  Return to clinic two months

## 2015-05-03 ENCOUNTER — Telehealth: Payer: Self-pay | Admitting: Orthopaedic Surgery

## 2015-05-03 MED ORDER — OXYCODONE-ACETAMINOPHEN 7.5-325 MG PO TABS: 1.0000 | ORAL_TABLET | ORAL | Status: DC | PRN

## 2015-05-03 NOTE — Telephone Encounter (Signed)
Patient called to request refill of medication:  oxyCODONE-acetaminophen (PERCOCET) 7.5-325 MG tablet - quantity 120.  Ph# [phone]

## 2015-05-03 NOTE — Telephone Encounter (Signed)
Rx done. 

## 2015-05-22 ENCOUNTER — Emergency Department (HOSPITAL_COMMUNITY): Payer: BLUE CROSS/BLUE SHIELD

## 2015-05-22 ENCOUNTER — Emergency Department (HOSPITAL_COMMUNITY)
Admission: EM | Admit: 2015-05-22 | Discharge: 2015-05-22 | Disposition: A | Discharge status: Home / Self Care | Payer: BLUE CROSS/BLUE SHIELD | Attending: Emergency Medicine | Admitting: Emergency Medicine

## 2015-05-22 ENCOUNTER — Encounter (HOSPITAL_COMMUNITY): Payer: Self-pay | Admitting: Emergency Medicine

## 2015-05-22 DIAGNOSIS — M5431 Sciatica, right side: Secondary | ICD-10-CM

## 2015-05-22 DIAGNOSIS — M5441 Lumbago with sciatica, right side: Secondary | ICD-10-CM | POA: Diagnosis not present

## 2015-05-22 DIAGNOSIS — F17210 Nicotine dependence, cigarettes, uncomplicated: Secondary | ICD-10-CM | POA: Diagnosis not present

## 2015-05-22 DIAGNOSIS — M545 Low back pain: Secondary | ICD-10-CM | POA: Diagnosis present

## 2015-05-22 MED ORDER — KETOROLAC TROMETHAMINE 60 MG/2ML IM SOLN
60.0000 mg | Freq: Once | INTRAMUSCULAR | Status: AC
  Administered 2015-05-22: 60 mg via INTRAMUSCULAR
  Filled 2015-05-22: qty 2

## 2015-05-22 MED ORDER — OXYCODONE-ACETAMINOPHEN 5-325 MG PO TABS
1.0000 | ORAL_TABLET | Freq: Once | ORAL | Status: AC
  Administered 2015-05-22: 1 via ORAL
  Filled 2015-05-22: qty 1

## 2015-05-22 MED ORDER — PREDNISONE 10 MG PO TABS: 20.0000 mg | ORAL_TABLET | Freq: Two times a day (BID) | ORAL | Status: DC

## 2015-05-22 NOTE — Discharge Instructions (Signed)
Follow up with Dr. Hilda LiasKeeling, take the Oxycodone that he prescribes for you. We are adding a steroid to help with the inflammation. You may take your Flexeril in addition.

## 2015-05-22 NOTE — ED Provider Notes (Signed)
CSN: [id number]     Arrival date & time 05/22/15  1736 History  By signing my name below, I, Shane Crawford, attest that this documentation has been prepared under the direction and in the presence of Kerrie BuffaloHope Neese, NP.  Electronically Signed: Lyndel SafeKaitlyn Crawford, ED Scribe. 05/22/2015. 6:58 PM.   Chief Complaint  Patient presents with  . Back Pain   Patient is a 30 y.o. male presenting with back pain. The history is provided by the patient. No language interpreter was used.  Back Pain Location:  Lumbar spine Radiates to:  R posterior upper leg and R thigh Pain severity:  Moderate Pain is:  Same all the time Duration:  3 days Timing:  Constant Progression:  Worsening Chronicity:  New Context: physical stress   Relieved by:  Nothing Worsened by:  Movement, sitting and standing Ineffective treatments:  Muscle relaxants, OTC medications and NSAIDs Associated symptoms: no abdominal pain, no bladder incontinence, no bowel incontinence, no dysuria, no fever, no numbness, no paresthesias, no perianal numbness, no tingling and no weakness   Risk factors: no hx of cancer    HPI Comments: Shane Crawford is a 30 y.o. male who presents to the Emergency Department complaining of constant right lower back pain X over 1 week that worsened 3 days ago with radiation into right SI joint and right upper leg. He notes performing lifting at work (5-10lbs) and 'throwing mulch out' at home. He has been taking tylenol, Aleve, and flexeril without significant relief. Pt denies dysuria, abdominal pain, bowel or bladder incontinence, fevers or chills, or any other associated symptoms.   Past Medical History  Diagnosis Date  . Migraines    Past Surgical History  Procedure Laterality Date  . Laparoscopy  10/08/2010    Procedure: LAPAROSCOPY DIAGNOSTIC;  Surgeon: Fabio BeringBrent C Ziegler;  [facility];  Service: General;  Laterality: N/A;  . Laparoscopic appendectomy  10/08/2010    Procedure: APPENDECTOMY  LAPAROSCOPIC;  Surgeon: Fabio BeringBrent C Ziegler;  Location: [facility];  Service: General;  Laterality: N/A;  . Colonoscopy  10/23/2010    Procedure: COLONOSCOPY;  Surgeon: Dalia HeadingMark A Jenkins;  [facility];  Service: Gastroenterology;  Laterality: N/A;  . Appendectomy  09/2010  . Cholecystectomy  11/07/2010    Procedure: LAPAROSCOPIC CHOLECYSTECTOMY;  Surgeon: Fabio BeringBrent C Ziegler;  Location[facility];  Service: General;  Laterality: N/A;  . Carpal tunnel release Right 03/09/2013    Procedure: RIGHT CARPAL TUNNEL RELEASE OPEN;  Surgeon: Darreld McleanWayne Keeling, MD;  Location: [facility];  Service: Orthopedics;  Laterality: Right;  . Carpal tunnel release Left 11/30/2013    Procedure: CARPAL TUNNEL RELEASE;  Surgeon: Darreld McleanWayne Keeling, MD;  Location: [facility];  Service: Orthopedics;  Laterality: Left;   Family History  Problem Relation Age of Onset  . Anesthesia problems Neg Hx   . Hypotension Neg Hx   . Malignant hyperthermia Neg Hx   . Pseudochol deficiency Neg Hx   . Cancer Mother   . Diabetes Mother   . Heart failure Mother   . Cancer Father   . Diabetes Father    Social History  Substance Use Topics  . Smoking status: Current Every Day Smoker -- 1.00 packs/day for 7 years    Types: Cigarettes    Last Attempt to Quit: 10/27/2010  . Smokeless tobacco: None  . Alcohol Use: Yes     Comment: occassional- 1 beer q 3 weeks    Review of Systems  Constitutional: Negative for fever and chills.  Gastrointestinal:  Negative for abdominal pain and bowel incontinence.  Genitourinary: Negative for bladder incontinence and dysuria.  Musculoskeletal: Positive for back pain. Negative for gait problem.  Skin: Negative for color change and wound.  Neurological: Negative for tingling, weakness, numbness and paresthesias.  All other systems reviewed and are negative.  Allergies  Review of patient's allergies indicates no known allergies.  Home Medications   Prior to Admission medications   Medication Sig Start Date End  Date Taking? Authorizing Provider  cyclobenzaprine (FLEXERIL) 10 MG tablet Take 1 tablet (10 mg total) by mouth 3 (three) times daily as needed for muscle spasms. Patient not taking: Reported on 04/04/2015 07/04/14   Bethann Berkshire, MD  LORazepam (ATIVAN) 1 MG tablet Take 1 mg by mouth every 8 (eight) hours as needed for anxiety. Reported on 04/04/2015    Historical Provider, MD  oxyCODONE-acetaminophen (PERCOCET) 7.5-325 MG tablet Take 1 tablet by mouth every 4 (four) hours as needed for moderate pain or severe pain (Must last 30 days.Do not take and drive a car or operate machinery). 05/03/15   Darreld Mclean, MD  predniSONE (DELTASONE) 10 MG tablet Take 2 tablets (20 mg total) by mouth 2 (two) times daily with a meal. 05/22/15   Hope Orlene Och, NP   BP 128/82 mmHg  Pulse 96  Temp(Src) 98.5 F (36.9 C) (Oral)  Resp 18  Ht 6' (1.829 m)  Wt 272 lb (123.378 kg)  BMI 36.88 kg/m2  SpO2 100% Physical Exam  Constitutional: He is oriented to person, place, and time. He appears well-developed and well-nourished. No distress.  HENT:  Head: Normocephalic and atraumatic.  Eyes: EOM are normal. Pupils are equal, round, and reactive to light.  Neck: Normal range of motion. Neck supple.  Cardiovascular: Normal rate and regular rhythm.   Pulmonary/Chest: Effort normal. No respiratory distress. He has no wheezes. He has no rales.  Abdominal: Soft. Bowel sounds are normal. There is no tenderness.  Musculoskeletal: Normal range of motion. He exhibits tenderness. He exhibits no edema.       Lumbar back: He exhibits tenderness. He exhibits normal range of motion, no deformity, no spasm and normal pulse.       Back:  No cervical or thoracic spine tenderness. Tender over lumbar spine and right lumber area with radiation to right sciatic nerve.   Neurological: He is alert and oriented to person, place, and time. He has normal strength. No cranial nerve deficit or sensory deficit. Coordination and gait normal.  Reflex  Scores:      Bicep reflexes are 2+ on the right side and 2+ on the left side.      Brachioradialis reflexes are 2+ on the right side and 2+ on the left side.      Patellar reflexes are 2+ on the right side and 2+ on the left side.      Achilles reflexes are 2+ on the right side and 2+ on the left side. Skin: Skin is warm and dry.  Psychiatric: He has a normal mood and affect. His behavior is normal.  Nursing note and vitals reviewed.   ED Course  Procedures  DIAGNOSTIC STUDIES: Oxygen Saturation is 100% on RA, normal by my interpretation.    COORDINATION OF CARE: 6:58 PM Discussed treatment plan with pt at bedside which includes Xray of lumbar spine and pt agreed to plan.   Imaging Review Dg Lumbar Spine Complete  05/22/2015  CLINICAL DATA:  Low back pain radiating to the right lower extremity for 1  week. No reported injury. EXAM: LUMBAR SPINE - COMPLETE 4+ VIEW COMPARISON:  07/04/2014 lumbar spine radiographs. FINDINGS: This report assumes 5 non rib-bearing lumbar vertebrae, noting diminutive ribs at the T12 level. Lumbar vertebral body heights are preserved, with no fracture. Lumbar disc heights are preserved. No spondylosis. No spondylolisthesis. No appreciable facet arthropathy. No evidence of bony foraminal stenosis. No aggressive appearing focal osseous lesions. Cholecystectomy clips are seen in the upper abdomen. IMPRESSION: Negative. Electronically Signed   By: Delbert Phenix M.D.   On: 05/22/2015 19:42   I have personally reviewed and evaluated these images results as part of my medical decision-making.   MDM  30 y.o. male with  Right side low back pain that radiates to the right buttock stable for d/c without focal neuro deficits. Patient currently taking Percocet 7.5/325 mg. Prescribed by Dr. Hilda Lias. Will add short course of prednisone and patient to call Dr. Hilda Lias for follow up.   Final diagnoses:  Sciatica, right   I personally performed the services described in this  documentation, which was scribed in my presence. The recorded information has been reviewed and is accurate.    [facility] Orlene Och, NP 05/24/15 1630  Samuel Jester, DO 05/24/15 2353

## 2015-05-22 NOTE — ED Notes (Signed)
Patient c/o lower right sided back pain. Aggravated with movement. Radiates down right leg.

## 2015-05-30 ENCOUNTER — Encounter: Payer: Self-pay | Admitting: Orthopaedic Surgery

## 2015-05-30 ENCOUNTER — Ambulatory Visit (INDEPENDENT_AMBULATORY_CARE_PROVIDER_SITE_OTHER): Payer: BLUE CROSS/BLUE SHIELD

## 2015-05-30 ENCOUNTER — Ambulatory Visit (INDEPENDENT_AMBULATORY_CARE_PROVIDER_SITE_OTHER): Payer: BLUE CROSS/BLUE SHIELD | Admitting: Orthopaedic Surgery

## 2015-05-30 VITALS — BP 141/101 | HR 118 | Temp 98.1°F | Ht 72.0 in | Wt 272.0 lb

## 2015-05-30 DIAGNOSIS — M542 Cervicalgia: Secondary | ICD-10-CM

## 2015-05-30 DIAGNOSIS — M25512 Pain in left shoulder: Secondary | ICD-10-CM | POA: Diagnosis not present

## 2015-05-30 MED ORDER — OXYCODONE-ACETAMINOPHEN 7.5-325 MG PO TABS: 1.0000 | ORAL_TABLET | ORAL | Status: DC | PRN

## 2015-05-30 NOTE — Patient Instructions (Addendum)
Needs work note for light duty  MRI ORDERED. We will contact your insurance company for pre-certification. After we receive that, we will schedule you an appointment for the MRI and contact you. If you have not heard from our office in one week, contact us.

## 2015-05-30 NOTE — Progress Notes (Signed)
Patient ZO:XWRUEAVWUJW:Shane Crawford, male DOB:1985-08-25, 30 y.o. JXB:[id number]  Chief Complaint  Patient presents with  . Follow-up    follow up left shoulder    HPI  Shane Crawford is a 30 y.o. male who has chronic left shoulder pain. However recently he has had paresthesias from the shoulder to the left elbow to the left hand more around the long and index fingers with prolonged numbness at times.  He has no trauma.  He has been doing his shoulder exercises.  He has no redness. He is not getting any better.  He is concerned as the numbness is worse.  HPI  Body mass index is 36.88 kg/(m^2).   Review of Systems  Respiratory: Negative for cough and shortness of breath.   Cardiovascular: Positive for chest pain.  Gastrointestinal: Positive for constipation.  Endocrine: Negative for cold intolerance.  Musculoskeletal: Positive for myalgias, back pain, arthralgias (left shoulder only) and neck pain.  Allergic/Immunologic: Positive for environmental allergies.  Neurological: Positive for numbness.  All other systems reviewed and are negative.   Past Medical History  Diagnosis Date  . Migraines     Past Surgical History  Procedure Laterality Date  . Laparoscopy  10/08/2010    Procedure: LAPAROSCOPY DIAGNOSTIC;  Surgeon: Fabio BeringBrent C Ziegler;  Location: [facility];  Service: General;  Laterality: N/A;  . Laparoscopic appendectomy  10/08/2010    Procedure: APPENDECTOMY LAPAROSCOPIC;  Surgeon: Fabio BeringBrent C Ziegler;  Location: [facility];  Service: General;  Laterality: N/A;  . Colonoscopy  10/23/2010    Procedure: COLONOSCOPY;  Surgeon: Dalia HeadingMark A Jenkins;  Location: [facility];  Service: Gastroenterology;  Laterality: N/A;  . Appendectomy  09/2010  . Cholecystectomy  11/07/2010    Procedure: LAPAROSCOPIC CHOLECYSTECTOMY;  Surgeon: Fabio BeringBrent C Ziegler;  Location: [facility];  Service: General;  Laterality: N/A;  . Carpal tunnel release Right 03/09/2013    Procedure: RIGHT CARPAL TUNNEL RELEASE OPEN;  Surgeon:  Darreld McleanWayne Artie Mcintyre, MD;  Location: [facility];  Service: Orthopedics;  Laterality: Right;  . Carpal tunnel release Left 11/30/2013    Procedure: CARPAL TUNNEL RELEASE;  Surgeon: Darreld McleanWayne Kaysen Sefcik, MD;  Location: [facility];  Service: Orthopedics;  Laterality: Left;    Family History  Problem Relation Age of Onset  . Anesthesia problems Neg Hx   . Hypotension Neg Hx   . Malignant hyperthermia Neg Hx   . Pseudochol deficiency Neg Hx   . Cancer Mother   . Diabetes Mother   . Heart failure Mother   . Cancer Father   . Diabetes Father     Social History Social History  Substance Use Topics  . Smoking status: Current Every Day Smoker -- 1.00 packs/day for 7 years    Types: Cigarettes    Last Attempt to Quit: 10/27/2010  . Smokeless tobacco: None  . Alcohol Use: Yes     Comment: occassional- 1 beer q 3 weeks    No Known Allergies  Current Outpatient Prescriptions  Medication Sig Dispense Refill  . oxyCODONE-acetaminophen (PERCOCET) 7.5-325 MG tablet Take 1 tablet by mouth every 4 (four) hours as needed for moderate pain or severe pain (Must last 30 days.Do not take and drive a car or operate machinery). 120 tablet 0  . cyclobenzaprine (FLEXERIL) 10 MG tablet Take 1 tablet (10 mg total) by mouth 3 (three) times daily as needed for muscle spasms. (Patient not taking: Reported on 05/30/2015) 20 tablet 0  . LORazepam (ATIVAN) 1 MG tablet Take 1 mg by mouth every 8 (eight)  hours as needed for anxiety. Reported on 05/30/2015    . predniSONE (DELTASONE) 10 MG tablet Take 2 tablets (20 mg total) by mouth 2 (two) times daily with a meal. (Patient not taking: Reported on 05/30/2015) 16 tablet 0   No current facility-administered medications for this visit.     Physical Exam  Blood pressure 141/101, pulse 118, temperature 98.1 F (36.7 C), height 6' (1.829 m), weight 272 lb (123.378 kg).  Constitutional: overall normal hygiene, normal nutrition, well developed, normal grooming, normal body habitus. Assistive  device:none  Musculoskeletal: gait and station Limp none, muscle tone and strength are normal, no tremors or atrophy is present.  .  Neurological: coordination overall normal.  Deep tendon reflex/nerve stretch intact.  Sensation normal.  Cranial nerves II-XII intact.   Skin:   Scars of wrist from prior surgery, otherwise overall no scars, lesions, ulcers or rashes. No psoriasis.  Psychiatric: Alert and oriented x 3.  Recent memory intact, remote memory unclear.  Normal mood and affect. Well groomed.  Good eye contact.  Cardiovascular: overall no swelling, no varicosities, no edema bilaterally, normal temperatures of the legs and arms, no clubbing, cyanosis and good capillary refill.  Lymphatic: palpation is normal.  His neck has full motion and no crepitus.  NV is intact. Examination of left Upper Extremity is done.  Inspection:   Overall:  Elbow non-tender without crepitus or defects, forearm non-tender without crepitus or defects, wrist non-tender without crepitus or defects, hand non-tender.    Shoulder: with glenohumeral joint tenderness, without effusion.   Upper arm: without swelling and tenderness   Range of motion:   Overall:  Full range of motion of the elbow, full range of motion of wrist and full range of motion in fingers.   Shoulder:  left  180 degrees forward flexion; 180 degrees abduction; 40 degrees internal rotation, 40 degrees external rotation, 20 degrees extension, 40 degrees adduction.   Stability:   Overall:  Shoulder, elbow and wrist stable   Strength and Tone:   Overall full shoulder muscles strength, full upper arm strength and normal upper arm bulk and tone.  Additional services performed: x-rays were done of the cervical spine.  See separate report.  The patient has been educated about the nature of the problem(s) and counseled on treatment options.  The patient appeared to understand what I have discussed and is in agreement with it.  Encounter Diagnoses   Name Primary?  . Neck pain Yes  . Pain in joint of left shoulder     PLAN Call if any problems.  Precautions discussed.  Continue current medications.   Return to clinic after MRI of the cervical spine.

## 2015-06-01 ENCOUNTER — Ambulatory Visit: Payer: Self-pay | Admitting: Orthopaedic Surgery

## 2015-06-02 ENCOUNTER — Encounter: Payer: Self-pay | Admitting: Orthopaedic Surgery

## 2015-06-05 ENCOUNTER — Telehealth: Payer: Self-pay | Admitting: Radiology

## 2015-06-05 NOTE — Telephone Encounter (Signed)
The insurance company is requesting a peer to peer review. They are asking that you call [phone]. The CPT code is 72141.  Thank you

## 2015-06-06 ENCOUNTER — Encounter: Payer: Self-pay | Admitting: Orthopaedic Surgery

## 2015-06-16 ENCOUNTER — Telehealth: Payer: Self-pay | Admitting: Orthopaedic Surgery

## 2015-06-20 NOTE — Telephone Encounter (Signed)
Left message for patient to return my call regarding MRI was denied, after a courtesy physician review was done.

## 2015-06-21 ENCOUNTER — Telehealth: Payer: Self-pay | Admitting: Orthopaedic Surgery

## 2015-06-21 NOTE — Telephone Encounter (Signed)
Shane Crawford returned your call.  He wants to know when he should return to see Dr. Hilda LiasKeeling.  Could you ask Dr. Hilda LiasKeeling and then call the patient.  Thanks

## 2015-06-21 NOTE — Telephone Encounter (Signed)
Routing to DR. Keeling 

## 2015-06-29 ENCOUNTER — Telehealth: Payer: Self-pay | Admitting: Orthopaedic Surgery

## 2015-06-29 MED ORDER — OXYCODONE-ACETAMINOPHEN 7.5-325 MG PO TABS: 1.0000 | ORAL_TABLET | ORAL | Status: DC | PRN

## 2015-06-29 NOTE — Telephone Encounter (Signed)
Rx done. 

## 2015-06-29 NOTE — Telephone Encounter (Signed)
Patient called and requested a refill on Oxycodone-Acetaminophen (Percocet)  7.5-325 mgs.   Qty  120    Sig: Take 1 tablet by mouth every 4 (four) hours as needed for moderate pain or severe pain (Must last 30 days.  Do not take and drive a car or operate machinery). °

## 2015-07-04 ENCOUNTER — Ambulatory Visit (INDEPENDENT_AMBULATORY_CARE_PROVIDER_SITE_OTHER): Payer: BLUE CROSS/BLUE SHIELD | Admitting: Orthopaedic Surgery

## 2015-07-04 ENCOUNTER — Encounter: Payer: Self-pay | Admitting: Orthopaedic Surgery

## 2015-07-04 VITALS — BP 158/98 | HR 101 | Temp 98.2°F | Resp 16 | Ht 72.0 in | Wt 287.0 lb

## 2015-07-04 DIAGNOSIS — M5090 Cervical disc disorder, unspecified, unspecified cervical region: Secondary | ICD-10-CM

## 2015-07-04 DIAGNOSIS — M542 Cervicalgia: Secondary | ICD-10-CM

## 2015-07-04 DIAGNOSIS — R531 Weakness: Secondary | ICD-10-CM

## 2015-07-04 NOTE — Progress Notes (Signed)
Patient ZO:XWRUEAVWUJW:Shane Crawford, male DOB:[birth year], 30 y.o. JXB:[id number]  Chief Complaint  Patient presents with  . Follow-up    left shoulder pain    HPI  Shane Crawford is a 30 y.o. male for follow-up of left neck pain with paresthesias.  He was denied MRI by his insurance company.  His neck pain is worse, he has more pain and weakness now of the left arm.  He has pain from the neck to the index finger with numbness.  He has noticed he is weaker with his left arm that is getting worse.  He was at church this past Sunday and put his arm over his wife while sitting in church.  He could not lift his arm up back to his side and his wife had to help him.  He had paresthesias as well.  He is getting worse.  He is taking his medicine.  The pain awakens him at night.  I feel he has a cervical disc.  I feel he needs a MRI.  I will talk peer to peer with the insurance company if needed to get the MRI.  HPI  Body mass index is 38.92 kg/(m^2).  Review of Systems  HENT: Negative for congestion.   Respiratory: Negative for cough and shortness of breath.   Cardiovascular: Negative for chest pain and leg swelling.  Gastrointestinal: Positive for constipation.  Endocrine: Negative for cold intolerance.  Musculoskeletal: Positive for myalgias, back pain, arthralgias (left shoulder only) and neck pain.  Allergic/Immunologic: Positive for environmental allergies.  Neurological: Positive for weakness, numbness and headaches.  All other systems reviewed and are negative.   Past Medical History  Diagnosis Date  . Migraines     Past Surgical History  Procedure Laterality Date  . Laparoscopy  10/08/2010    Procedure: LAPAROSCOPY DIAGNOSTIC;  Surgeon: Fabio BeringBrent C Ziegler;  Location: [facility];  Service: General;  Laterality: N/A;  . Laparoscopic appendectomy  10/08/2010    Procedure: APPENDECTOMY LAPAROSCOPIC;  Surgeon: Fabio BeringBrent C Ziegler;  Location: [facility];  Service: General;  Laterality: N/A;  .  Colonoscopy  10/23/2010    Procedure: COLONOSCOPY;  Surgeon: Dalia HeadingMark A Jenkins;  [facility]: [facility];  Service: Gastroenterology;  Laterality: N/A;  . Appendectomy  09/2010  . Cholecystectomy  11/07/2010    Procedure: LAPAROSCOPIC CHOLECYSTECTOMY;  Surgeon: Fabio BeringBrent C Ziegler;  Location: [facility];  Service: General;  Laterality: N/A;  . Carpal tunnel release Right 03/09/2013    Procedure: RIGHT CARPAL TUNNEL RELEASE OPEN;  Surgeon: Darreld McleanWayne Tahjae Clausing, MD;  Location: [facility];  Service: Orthopedics;  Laterality: Right;  . Carpal tunnel release Left 11/30/2013    Procedure: CARPAL TUNNEL RELEASE;  Surgeon: Darreld McleanWayne Jaleel Allen, MD;  Location: [facility];  Service: Orthopedics;  Laterality: Left;    Family History  Problem Relation Age of Onset  . Anesthesia problems Neg Hx   . Hypotension Neg Hx   . Malignant hyperthermia Neg Hx   . Pseudochol deficiency Neg Hx   . Cancer Mother   . Diabetes Mother   . Heart failure Mother   . Cancer Father   . Diabetes Father     Social History Social History  Substance Use Topics  . Smoking status: Current Every Day Smoker -- 1.00 packs/day for 7 years    Types: Cigarettes    Last Attempt to Quit: 10/27/2010  . Smokeless tobacco: None  . Alcohol Use: Yes     Comment: occassional- 1 beer q 3 weeks    No Known  Allergies  Current Outpatient Prescriptions  Medication Sig Dispense Refill  . oxyCODONE-acetaminophen (PERCOCET) 7.5-325 MG tablet Take 1 tablet by mouth every 4 (four) hours as needed for moderate pain or severe pain (Must last 30 days.Do not take and drive a car or operate machinery). 120 tablet 0  . predniSONE (DELTASONE) 10 MG tablet Take 2 tablets (20 mg total) by mouth 2 (two) times daily with a meal. 16 tablet 0   No current facility-administered medications for this visit.     Physical Exam  Blood pressure 158/98, pulse 101, temperature 98.2 F (36.8 C), resp. rate 16, height 6' (1.829 m), weight 287 lb (130.182 kg).  Constitutional: overall  normal hygiene, normal nutrition, well developed, normal grooming, normal body habitus. Assistive device:none  Musculoskeletal: gait and station Limp none, muscle tone and strength are normal, no tremors or atrophy is present.  .  Neurological: coordination overall normal.  Deep tendon reflex/nerve stretch intact.  Sensation normal.  Cranial nerves II-XII intact.   Skin:   niormal overall no scars, lesions, ulcers or rashes. No psoriasis.  Psychiatric: Alert and oriented x 3.  Recent memory intact, remote memory unclear.  Normal mood and affect. Well groomed.  Good eye contact.  Cardiovascular: overall no swelling, no varicosities, no edema bilaterally, normal temperatures of the legs and arms, no clubbing, cyanosis and good capillary refill.  Lymphatic: palpation is normal.  Right Shoulder Exam  Right shoulder exam is normal.  Muscle Strength  The patient has normal right shoulder strength.  Other  Erythema: absent Scars: absent Sensation: normal Pulse: present   Left Shoulder Exam   Tenderness  The patient is experiencing tenderness in the acromioclavicular joint.  Muscle Strength  Abduction: 4/5  Internal Rotation: 4/5  External Rotation: 4/5  Supraspinatus: 4/5  Subscapularis: 4/5  Biceps: 4/5   Tests  Apprehension: negative  Other  Erythema: absent Scars: absent Sensation: decreased Pulse: present   Comments:  He has weakness of the left triceps on extension of 4 of 5.  His grip is weaker on the left than the right.  Sensation decreased to the dermatone C6 and C7     Dynameter reading was done of the grips. It was measured in pounds force.  On the right at the first position it was 34, on the left 12. On the right at the second position it was 100, 38 on the left On the right at the third position it was 90, 20 on the left On the right at the fourth position it was 92 and 22 on the left At the fifth position on the right it was 58 and 18 on the  left.  This shows weakness significantly on the left side.  The patient has been educated about the nature of the problem(s) and counseled on treatment options.  The patient appeared to understand what I have discussed and is in agreement with it.  Encounter Diagnoses  Name Primary?  . Weakness Yes  . Cervical neck pain with evidence of disc disease   . Neck pain     PLAN Call if any problems.  Precautions discussed.  Continue current medications.   Return to clinic after the MRI of the cervical spine.

## 2015-07-04 NOTE — Patient Instructions (Signed)
MRI ORDERED. We will contact your insurance company for pre-certification. After we receive that, we will schedule you an appointment for the MRI and contact you. If you have not heard from our office in one week, contact us.     

## 2015-07-10 ENCOUNTER — Ambulatory Visit (HOSPITAL_COMMUNITY)
Admission: RE | Admit: 2015-07-10 | Discharge: 2015-07-10 | Disposition: A | Discharge status: Home / Self Care | Payer: BLUE CROSS/BLUE SHIELD | Source: Ambulatory Visit | Attending: Orthopaedic Surgery | Admitting: Orthopaedic Surgery

## 2015-07-10 DIAGNOSIS — M50223 Other cervical disc displacement at C6-C7 level: Secondary | ICD-10-CM | POA: Diagnosis not present

## 2015-07-10 DIAGNOSIS — M50222 Other cervical disc displacement at C5-C6 level: Secondary | ICD-10-CM | POA: Insufficient documentation

## 2015-07-10 DIAGNOSIS — M50221 Other cervical disc displacement at C4-C5 level: Secondary | ICD-10-CM | POA: Diagnosis not present

## 2015-07-10 DIAGNOSIS — M4802 Spinal stenosis, cervical region: Secondary | ICD-10-CM | POA: Diagnosis not present

## 2015-07-10 DIAGNOSIS — R531 Weakness: Secondary | ICD-10-CM | POA: Diagnosis present

## 2015-07-18 ENCOUNTER — Encounter: Payer: Self-pay | Admitting: Orthopaedic Surgery

## 2015-07-18 ENCOUNTER — Ambulatory Visit (INDEPENDENT_AMBULATORY_CARE_PROVIDER_SITE_OTHER): Payer: BLUE CROSS/BLUE SHIELD | Admitting: Orthopaedic Surgery

## 2015-07-18 VITALS — BP 163/81 | HR 91 | Temp 97.3°F | Ht 72.0 in | Wt 284.0 lb

## 2015-07-18 DIAGNOSIS — M25512 Pain in left shoulder: Secondary | ICD-10-CM | POA: Diagnosis not present

## 2015-07-18 DIAGNOSIS — R531 Weakness: Secondary | ICD-10-CM

## 2015-07-18 DIAGNOSIS — M542 Cervicalgia: Secondary | ICD-10-CM

## 2015-07-18 NOTE — Progress Notes (Signed)
Patient Shane Crawford, male DOB:1985/11/03, 30 y.o. JXB:[id number]  Chief Complaint  Patient presents with  . Results    MRI C Spine    HPI  KARIM AIELLO is a 30 y.o. male who has had neck pain, left sided paresthesias and weakness.  He had a MRI of the cervical spine on 07-10-15.    MRI shows:  IMPRESSION: Mild left foraminal narrowing C4-5 due to small uncovertebral spurs.  Shallow right paracentral protrusion C6-7 effaces the ventral thecal sac without deforming the cord.  Minimal disc bulge C5-6 without central canal or foraminal Narrowing.  I explained the findings to him.  He does not need any surgery at this time.  I want him to continue his current medicine and treatments.  He is not weak like he was before.  His pain is less. HPI  Body mass index is 38.51 kg/(m^2).  ROS  Review of Systems  HENT: Negative for congestion.   Respiratory: Negative for cough and shortness of breath.   Cardiovascular: Negative for chest pain and leg swelling.  Gastrointestinal: Positive for constipation.  Endocrine: Negative for cold intolerance.  Musculoskeletal: Positive for myalgias, back pain, arthralgias (left shoulder only) and neck pain.  Allergic/Immunologic: Positive for environmental allergies.  Neurological: Positive for weakness, numbness and headaches.  All other systems reviewed and are negative.   Past Medical History  Diagnosis Date  . Migraines     Past Surgical History  Procedure Laterality Date  . Laparoscopy  10/08/2010    Procedure: LAPAROSCOPY DIAGNOSTIC;  Surgeon: Fabio Bering;  Location: [facility];  Service: General;  Laterality: N/A;  . Laparoscopic appendectomy  10/08/2010    Procedure: APPENDECTOMY LAPAROSCOPIC;  Surgeon: Fabio Bering;  Location: [facility];  Service: General;  Laterality: N/A;  . Colonoscopy  10/23/2010    Procedure: COLONOSCOPY;  Surgeon: Dalia Heading;  [facility]: [facility];  Service: Gastroenterology;  Laterality:  N/A;  . Appendectomy  09/2010  . Cholecystectomy  11/07/2010    Procedure: LAPAROSCOPIC CHOLECYSTECTOMY;  Surgeon: Fabio Bering;  Location: [facility];  Service: General;  Laterality: N/A;  . Carpal tunnel release Right 03/09/2013    Procedure: RIGHT CARPAL TUNNEL RELEASE OPEN;  Surgeon: Darreld Mclean, MD;  Location: [facility];  Service: Orthopedics;  Laterality: Right;  . Carpal tunnel release Left 11/30/2013    Procedure: CARPAL TUNNEL RELEASE;  Surgeon: Darreld Mclean, MD;  Location: [facility];  Service: Orthopedics;  Laterality: Left;    Family History  Problem Relation Age of Onset  . Anesthesia problems Neg Hx   . Hypotension Neg Hx   . Malignant hyperthermia Neg Hx   . Pseudochol deficiency Neg Hx   . Cancer Mother   . Diabetes Mother   . Heart failure Mother   . Cancer Father   . Diabetes Father     Social History Social History  Substance Use Topics  . Smoking status: Current Every Day Smoker -- 1.00 packs/day for 7 years    Types: Cigarettes    Last Attempt to Quit: 10/27/2010  . Smokeless tobacco: None  . Alcohol Use: Yes     Comment: occassional- 1 beer q 3 weeks    No Known Allergies  Current Outpatient Prescriptions  Medication Sig Dispense Refill  . oxyCODONE-acetaminophen (PERCOCET) 7.5-325 MG tablet Take 1 tablet by mouth every 4 (four) hours as needed for moderate pain or severe pain (Must last 30 days.Do not take and drive a car or operate machinery). 120 tablet  0  . predniSONE (DELTASONE) 10 MG tablet Take 2 tablets (20 mg total) by mouth 2 (two) times daily with a meal. 16 tablet 0   No current facility-administered medications for this visit.     Physical Exam  Blood pressure 163/81, pulse 91, temperature 97.3 F (36.3 C), height 6' (1.829 m), weight 284 lb (128.822 kg).  Constitutional: overall normal hygiene, normal nutrition, well developed, normal grooming, normal body habitus. Assistive device:none  Musculoskeletal: gait and station Limp none,  muscle tone and strength are normal, no tremors or atrophy is present.  .  Neurological: coordination overall normal.  Deep tendon reflex/nerve stretch intact.  Sensation normal.  Cranial nerves II-XII intact.   Skin:   normal overall no scars, lesions, ulcers or rashes. No psoriasis.  Psychiatric: Alert and oriented x 3.  Recent memory intact, remote memory unclear.  Normal mood and affect. Well groomed.  Good eye contact.  Cardiovascular: overall no swelling, no varicosities, no edema bilaterally, normal temperatures of the legs and arms, no clubbing, cyanosis and good capillary refill.  Lymphatic: palpation is normal.  He has full ROM of the neck with normal reflexes.  He is not weak on the left side today as in the past.  Grips are normal. ROM of the left shoulder is tender but full.  The patient has been educated about the nature of the problem(s) and counseled on treatment options.  The patient appeared to understand what I have discussed and is in agreement with it.  Encounter Diagnoses  Name Primary?  . Neck pain Yes  . Pain in joint of left shoulder   . Weakness     PLAN Call if any problems.  Precautions discussed.  Continue current medications.   Return to clinic 3 weeks

## 2015-07-27 ENCOUNTER — Telehealth: Payer: Self-pay | Admitting: Orthopaedic Surgery

## 2015-07-27 MED ORDER — OXYCODONE-ACETAMINOPHEN 7.5-325 MG PO TABS: 1.0000 | ORAL_TABLET | ORAL | Status: DC | PRN

## 2015-07-27 NOTE — Telephone Encounter (Signed)
Patient called and requested a refill on Oxycodone-Acetaminophen 7.5-325 mgs.   Qty  120   Sig: Take 1 tablet by mouth every 4 (four) hours as needed for moderate pain or severe pain (Must last 30 days.Do not take and drive a car or operate machinery).

## 2015-07-27 NOTE — Telephone Encounter (Signed)
Rx done. 

## 2015-08-08 ENCOUNTER — Ambulatory Visit (INDEPENDENT_AMBULATORY_CARE_PROVIDER_SITE_OTHER): Payer: BLUE CROSS/BLUE SHIELD | Admitting: Orthopaedic Surgery

## 2015-08-08 ENCOUNTER — Encounter: Payer: Self-pay | Admitting: Orthopaedic Surgery

## 2015-08-08 VITALS — BP 143/85 | HR 93 | Temp 97.5°F | Ht 72.0 in | Wt 290.4 lb

## 2015-08-08 DIAGNOSIS — M25512 Pain in left shoulder: Secondary | ICD-10-CM | POA: Diagnosis not present

## 2015-08-08 DIAGNOSIS — M542 Cervicalgia: Secondary | ICD-10-CM | POA: Diagnosis not present

## 2015-08-08 NOTE — Progress Notes (Signed)
Patient [id number] Shane Crawford, male DOB:[birth year], 30 y.o. FAO:[id number]  Chief Complaint  Patient presents with  . Follow-up    neck and shoulder pain    HPI  KLAYTON MONIE is a 30 y.o. male who is doing fairly well with his chronic left shoulder pain. He has no new events or trauma. He has no paresthesias.  He has been doing light duty work.  He has no redness or more loss of motion of the shoulder.  His neck is stable.  He has no further weakness episodes.  He is doing his exercises.  HPI  Body mass index is 39.38 kg/(m^2).  ROS  Review of Systems  HENT: Negative for congestion.   Respiratory: Negative for cough and shortness of breath.   Cardiovascular: Negative for chest pain and leg swelling.  Gastrointestinal: Positive for constipation.  Endocrine: Negative for cold intolerance.  Musculoskeletal: Positive for myalgias, back pain, arthralgias (left shoulder only) and neck pain.  Allergic/Immunologic: Positive for environmental allergies.  Neurological: Positive for weakness, numbness and headaches.  All other systems reviewed and are negative.   Past Medical History  Diagnosis Date  . Migraines     Past Surgical History  Procedure Laterality Date  . Laparoscopy  10/08/2010    Procedure: LAPAROSCOPY DIAGNOSTIC;  Surgeon: Fabio Bering;  Location: [facility];  Service: General;  Laterality: N/A;  . Laparoscopic appendectomy  10/08/2010    Procedure: APPENDECTOMY LAPAROSCOPIC;  Surgeon: Fabio Bering;  Location: [facility];  Service: General;  Laterality: N/A;  . Colonoscopy  10/23/2010    Procedure: COLONOSCOPY;  Surgeon: Dalia Heading;  [facility]: [facility];  Service: Gastroenterology;  Laterality: N/A;  . Appendectomy  09/2010  . Cholecystectomy  11/07/2010    Procedure: LAPAROSCOPIC CHOLECYSTECTOMY;  Surgeon: Fabio Bering;  Location: [facility];  Service: General;  Laterality: N/A;  . Carpal tunnel release Right 03/09/2013    Procedure: RIGHT CARPAL TUNNEL  RELEASE OPEN;  Surgeon: Darreld Mclean, MD;  Location: [facility];  Service: Orthopedics;  Laterality: Right;  . Carpal tunnel release Left 11/30/2013    Procedure: CARPAL TUNNEL RELEASE;  Surgeon: Darreld Mclean, MD;  Location: [facility];  Service: Orthopedics;  Laterality: Left;    Family History  Problem Relation Age of Onset  . Anesthesia problems Neg Hx   . Hypotension Neg Hx   . Malignant hyperthermia Neg Hx   . Pseudochol deficiency Neg Hx   . Cancer Mother   . Diabetes Mother   . Heart failure Mother   . Cancer Father   . Diabetes Father     Social History Social History  Substance Use Topics  . Smoking status: Current Every Day Smoker -- 1.00 packs/day for 7 years    Types: Cigarettes    Last Attempt to Quit: 10/27/2010  . Smokeless tobacco: None  . Alcohol Use: Yes     Comment: occassional- 1 beer q 3 weeks    No Known Allergies  Current Outpatient Prescriptions  Medication Sig Dispense Refill  . oxyCODONE-acetaminophen (PERCOCET) 7.5-325 MG tablet Take 1 tablet by mouth every 4 (four) hours as needed for moderate pain or severe pain (Must last 30 days.Do not take and drive a car or operate machinery). 120 tablet 0  . predniSONE (DELTASONE) 10 MG tablet Take 2 tablets (20 mg total) by mouth 2 (two) times daily with a meal. 16 tablet 0   No current facility-administered medications for this visit.     Physical Exam  Blood  pressure 143/85, pulse 93, temperature 97.5 F (36.4 C), height 6' (1.829 m), weight 290 lb 6.4 oz (131.725 kg).  Constitutional: overall normal hygiene, normal nutrition, well developed, normal grooming, normal body habitus. Assistive device:none  Musculoskeletal: gait and station Limp none, muscle tone and strength are normal, no tremors or atrophy is present.  .  Neurological: coordination overall normal.  Deep tendon reflex/nerve stretch intact.  Sensation normal.  Cranial nerves II-XII intact.   Skin:   normal overall no scars, lesions,  ulcers or rashes. No psoriasis.  Psychiatric: Alert and oriented x 3.  Recent memory intact, remote memory unclear.  Normal mood and affect. Well groomed.  Good eye contact.  Cardiovascular: overall no swelling, no varicosities, no edema bilaterally, normal temperatures of the legs and arms, no clubbing, cyanosis and good capillary refill.  Lymphatic: palpation is normal.  Examination of left Upper Extremity is done.  Inspection:   Overall:  Elbow non-tender without crepitus or defects, forearm non-tender without crepitus or defects, wrist non-tender without crepitus or defects, hand non-tender.    Shoulder: with glenohumeral joint tenderness, without effusion.   Upper arm: without swelling and tenderness   Range of motion:   Overall:  Full range of motion of the elbow, full range of motion of wrist and full range of motion in fingers.   Shoulder:  left  180 degrees forward flexion; 180 degrees abduction; 40 degrees internal rotation, 40 degrees external rotation, 20 degrees extension, 40 degrees adduction.   Stability:   Overall:  Shoulder, elbow and wrist stable   Strength and Tone:   Overall full shoulder muscles strength, full upper arm strength and normal upper arm bulk and tone.  He has full motion of the neck with no numbness or weakness.  The patient has been educated about the nature of the problem(s) and counseled on treatment options.  The patient appeared to understand what I have discussed and is in agreement with it.  Encounter Diagnoses  Name Primary?  . Neck pain Yes  . Pain in joint of left shoulder     PLAN Call if any problems.  Precautions discussed.  Continue current medications.   Return to clinic 6 weeks

## 2015-08-30 ENCOUNTER — Telehealth: Payer: Self-pay | Admitting: Orthopaedic Surgery

## 2015-08-30 MED ORDER — OXYCODONE-ACETAMINOPHEN 7.5-325 MG PO TABS: 1.0000 | ORAL_TABLET | ORAL | Status: DC | PRN

## 2015-08-30 NOTE — Telephone Encounter (Signed)
Patient called and requested a refill on Oxycodone-Acetaminophen (Percocet)  7.5-325 mgs.   Qty  120    Sig: Take 1 tablet by mouth every 4 (four) hours as needed for moderate pain or severe pain (Must last 30 days.  Do not take and drive a car or operate machinery). °

## 2015-08-30 NOTE — Telephone Encounter (Signed)
Rx Done . 

## 2015-09-19 ENCOUNTER — Encounter: Payer: Self-pay | Admitting: Orthopaedic Surgery

## 2015-09-19 ENCOUNTER — Ambulatory Visit (INDEPENDENT_AMBULATORY_CARE_PROVIDER_SITE_OTHER): Payer: BLUE CROSS/BLUE SHIELD | Admitting: Orthopaedic Surgery

## 2015-09-19 VITALS — BP 126/80 | HR 90 | Temp 97.5°F | Ht 72.0 in | Wt 291.4 lb

## 2015-09-19 DIAGNOSIS — M542 Cervicalgia: Secondary | ICD-10-CM

## 2015-09-19 DIAGNOSIS — M25512 Pain in left shoulder: Secondary | ICD-10-CM

## 2015-09-19 NOTE — Progress Notes (Signed)
Patient Shane Crawford, male DOB:17-Jul-1985, 30 y.o. OQH:[id number]  Chief Complaint  Patient presents with  . Follow-up    Neck and Shoulder pain    HPI  Shane Crawford is a 30 y.o. male who has chronic neck pain and left shoulder pain.  He is stable. He has no weakness, no paresthesias.  He is working daily and taking his medicine. He has no new problem. HPI  Body mass index is 39.52 kg/m.  ROS  Review of Systems  HENT: Positive for congestion.   Respiratory: Negative for cough and shortness of breath.   Cardiovascular: Negative for chest pain and leg swelling.  Endocrine: Positive for cold intolerance.  Musculoskeletal: Positive for arthralgias and neck pain.  Allergic/Immunologic: Negative for environmental allergies.    Past Medical History:  Diagnosis Date  . Migraines     Past Surgical History:  Procedure Laterality Date  . APPENDECTOMY  09/2010  . CARPAL TUNNEL RELEASE Right 03/09/2013   Procedure: RIGHT CARPAL TUNNEL RELEASE OPEN;  Surgeon: Darreld Mclean, MD;  Location: [facility];  Service: Orthopedics;  Laterality: Right;  . CARPAL TUNNEL RELEASE Left 11/30/2013   Procedure: CARPAL TUNNEL RELEASE;  Surgeon: Darreld Mclean, MD;  Location: [facility];  Service: Orthopedics;  Laterality: Left;  . CHOLECYSTECTOMY  11/07/2010   Procedure: LAPAROSCOPIC CHOLECYSTECTOMY;  Surgeon: Fabio Bering;  Location: [facility];  Service: General;  Laterality: N/A;  . COLONOSCOPY  10/23/2010   Procedure: COLONOSCOPY;  Surgeon: Dalia Heading;  [facility];  Service: Gastroenterology;  Laterality: N/A;  . LAPAROSCOPIC APPENDECTOMY  10/08/2010   Procedure: APPENDECTOMY LAPAROSCOPIC;  Surgeon: Fabio Bering;  Location: [facility];  Service: General;  Laterality: N/A;  . LAPAROSCOPY  10/08/2010   Procedure: LAPAROSCOPY DIAGNOSTIC;  Surgeon: Fabio Bering;  Location: [facility];  Service: General;  Laterality: N/A;    Family History  Problem Relation Age of Onset  . Anesthesia  problems Neg Hx   . Hypotension Neg Hx   . Malignant hyperthermia Neg Hx   . Pseudochol deficiency Neg Hx   . Cancer Mother   . Diabetes Mother   . Heart failure Mother   . Cancer Father   . Diabetes Father     Social History Social History  Substance Use Topics  . Smoking status: Current Every Day Smoker    Packs/day: 1.00    Years: 7.00    Types: Cigarettes    Last attempt to quit: 10/27/2010  . Smokeless tobacco: Not on file  . Alcohol use Yes     Comment: occassional- 1 beer q 3 weeks    No Known Allergies  Current Outpatient Prescriptions  Medication Sig Dispense Refill  . oxyCODONE-acetaminophen (PERCOCET) 7.5-325 MG tablet Take 1 tablet by mouth every 4 (four) hours as needed for moderate pain or severe pain (Must last 30 days.Do not take and drive a car or operate machinery). 120 tablet 0  . predniSONE (DELTASONE) 10 MG tablet Take 2 tablets (20 mg total) by mouth 2 (two) times daily with a meal. 16 tablet 0   No current facility-administered medications for this visit.      Physical Exam  Blood pressure 126/80, pulse 90, temperature 97.5 F (36.4 C), height 6' (1.829 m), weight 291 lb 6.4 oz (132.2 kg).  Constitutional: overall normal hygiene, normal nutrition, well developed, normal grooming, normal body habitus. Assistive device:none  Musculoskeletal: gait and station Limp none, muscle tone and strength are normal, no tremors or atrophy is  present.  .  Neurological: coordination overall normal.  Deep tendon reflex/nerve stretch intact.  Sensation normal.  Cranial nerves II-XII intact.   Skin:   normal overall no scars, lesions, ulcers or rashes. No psoriasis.  Psychiatric: Alert and oriented x 3.  Recent memory intact, remote memory unclear.  Normal mood and affect. Well groomed.  Good eye contact.  Cardiovascular: overall no swelling, no varicosities, no edema bilaterally, normal temperatures of the legs and arms, no clubbing, cyanosis and good capillary  refill.  Lymphatic: palpation is normal.  Neck has full ROM.  Examination of left Upper Extremity is done.  Inspection:   Overall:  Elbow non-tender without crepitus or defects, forearm non-tender without crepitus or defects, wrist non-tender without crepitus or defects, hand non-tender.    Shoulder: with glenohumeral joint tenderness, without effusion.   Upper arm: without swelling and tenderness   Range of motion:   Overall:  Full range of motion of the elbow, full range of motion of wrist and full range of motion in fingers.   Shoulder:  left  165 degrees forward flexion; 150 degrees abduction; 35 degrees internal rotation, 35 degrees external rotation, 20 degrees extension, 40 degrees adduction.   Stability:   Overall:  Shoulder, elbow and wrist stable   Strength and Tone:   Overall full shoulder muscles strength, full upper arm strength and normal upper arm bulk and tone.   The patient has been educated about the nature of the problem(s) and counseled on treatment options.  The patient appeared to understand what I have discussed and is in agreement with it.  Encounter Diagnoses  Name Primary?  . Neck pain Yes  . Pain in joint of left shoulder     PLAN Call if any problems.  Precautions discussed.  Continue current medications.   Return to clinic 2 months   Electronically Signed Darreld Mclean, MD 7/25/20178:47 AM

## 2015-09-27 ENCOUNTER — Telehealth: Payer: Self-pay | Admitting: Orthopaedic Surgery

## 2015-09-27 MED ORDER — OXYCODONE-ACETAMINOPHEN 7.5-325 MG PO TABS: 1.0000 | ORAL_TABLET | ORAL | 0 refills | Status: DC | PRN

## 2015-09-27 NOTE — Telephone Encounter (Signed)
Patient requested a refill on Oxycodone/Acetaminophe 7.5-325 mgs.  Qty  120  Sig: Take 1 tablet by mouth every 4 (four) hours as needed for moderate pain or severe pain (Must last 30 days.Do not take and drive a car or operate machinery).

## 2015-10-26 ENCOUNTER — Telehealth: Payer: Self-pay | Admitting: Orthopaedic Surgery

## 2015-10-26 MED ORDER — OXYCODONE-ACETAMINOPHEN 7.5-325 MG PO TABS: 1.0000 | ORAL_TABLET | Freq: Four times a day (QID) | ORAL | 0 refills | Status: DC | PRN

## 2015-10-26 NOTE — Telephone Encounter (Signed)
Patient called and canceled and requested a refill on Oxycodone/Acetaminophen 7.5-325 mgs.  Qty  120  Sig: Take 1 tablet by mouth every 4 (four) hours as needed for moderate pain or severe pain (Must last 30 days.Do not take and drive a car or operate machinery).

## 2015-11-13 ENCOUNTER — Encounter: Payer: Self-pay | Admitting: Family Medicine

## 2015-11-14 ENCOUNTER — Encounter: Payer: Self-pay | Admitting: Family Medicine

## 2015-11-14 ENCOUNTER — Ambulatory Visit (INDEPENDENT_AMBULATORY_CARE_PROVIDER_SITE_OTHER): Payer: BLUE CROSS/BLUE SHIELD | Admitting: Family Medicine

## 2015-11-14 VITALS — BP 142/88 | HR 96 | Resp 18 | Ht 72.25 in | Wt 286.0 lb

## 2015-11-14 DIAGNOSIS — Z720 Tobacco use: Secondary | ICD-10-CM | POA: Diagnosis not present

## 2015-11-14 DIAGNOSIS — Z7689 Persons encountering health services in other specified circumstances: Secondary | ICD-10-CM

## 2015-11-14 DIAGNOSIS — Z7189 Other specified counseling: Secondary | ICD-10-CM

## 2015-11-14 DIAGNOSIS — E669 Obesity, unspecified: Secondary | ICD-10-CM | POA: Diagnosis not present

## 2015-11-14 DIAGNOSIS — J0100 Acute maxillary sinusitis, unspecified: Secondary | ICD-10-CM | POA: Diagnosis not present

## 2015-11-14 DIAGNOSIS — IMO0001 Reserved for inherently not codable concepts without codable children: Secondary | ICD-10-CM

## 2015-11-14 DIAGNOSIS — R030 Elevated blood-pressure reading, without diagnosis of hypertension: Secondary | ICD-10-CM

## 2015-11-14 MED ORDER — HYDROCOD POLST-CPM POLST ER 10-8 MG/5ML PO SUER: 5.0000 mL | Freq: Two times a day (BID) | ORAL | 0 refills | Status: DC

## 2015-11-14 MED ORDER — AZITHROMYCIN 250 MG PO TABS: ORAL_TABLET | ORAL | 0 refills | Status: DC

## 2015-11-14 NOTE — Patient Instructions (Addendum)
Rest push fluids Off work 2 days Take the z pak as instructed Take the tussionex for cough Use humidifier  Try to cut down or quit smoking  Need records Dr Selena BattenKim  See me in a month for a PE    Upper Respiratory Infection, Adult Most upper respiratory infections (URIs) are caused by a virus. A URI affects the nose, throat, and upper air passages. The most common type of URI is often called "the common cold." HOME CARE   Take medicines only as told by your doctor.  Gargle warm saltwater or take cough drops to comfort your throat as told by your doctor.  Use a warm mist humidifier or inhale steam from a shower to increase air moisture. This may make it easier to breathe.  Drink enough fluid to keep your pee (urine) clear or pale yellow.  Eat soups and other clear broths.  Have a healthy diet.  Rest as needed.  Go back to work when your fever is gone or your doctor says it is okay.  You may need to stay home longer to avoid giving your URI to others.  You can also wear a face mask and wash your hands often to prevent spread of the virus.  Use your inhaler more if you have asthma.  Do not use any tobacco products, including cigarettes, chewing tobacco, or electronic cigarettes. If you need help quitting, ask your doctor. GET HELP IF:  You are getting worse, not better.  Your symptoms are not helped by medicine.  You have chills.  You are getting more short of breath.  You have brown or red mucus.  You have yellow or brown discharge from your nose.  You have pain in your face, especially when you bend forward.  You have a fever.  You have puffy (swollen) neck glands.  You have pain while swallowing.  You have white areas in the back of your throat. GET HELP RIGHT AWAY IF:   You have very bad or constant:  Headache.  Ear pain.  Pain in your forehead, behind your eyes, and over your cheekbones (sinus pain).  Chest pain.  You have long-lasting  (chronic) lung disease and any of the following:  Wheezing.  Long-lasting cough.  Coughing up blood.  A change in your usual mucus.  You have a stiff neck.  You have changes in your:  Vision.  Hearing.  Thinking.  Mood. MAKE SURE YOU:   Understand these instructions.  Will watch your condition.  Will get help right away if you are not doing well or get worse.   This information is not intended to replace advice given to you by your health care provider. Make sure you discuss any questions you have with your health care provider.   Document Released: 07/31/2007 Document Revised: 06/28/2014 Document Reviewed: 05/19/2013 Elsevier Interactive Patient Education [employer]2016 Elsevier Inc.

## 2015-11-14 NOTE — Progress Notes (Signed)
Chief Complaint  Patient presents with  . Establish Care    previously with Dr. 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  He needs to evaluate the pain, FIX the problem, and use the narcotics only as needed. He needs to go back to the ortho and ask to go to PT or to a pain management doc for injections. He has elevated BP and is obese.  I told him these are problems he needs to work on.  Lifestyle changes are needed He smokes cigarettes AND chews tobacco.  I have discussed the multiple health risks associated with cigarette smoking including, but not limited to, cardiovascular disease, lung disease and cancer.  I have strongly recommended that smoking be stopped.  I have reviewed the various methods of quitting including cold [place], classes, nicotine replacements and prescription medications.  I have offered assistance in this difficult process.  The patient is not interested in assistance at this time.  He also has a URI for 5-6 days with productive cough and face pain and PND.  Coughs so hard he vomits.  Patient Active Problem List   Diagnosis Date Noted  . Tobacco abuse 11/14/2015  . Obesity 11/14/2015  . Pain in joint of left shoulder 04/04/2015    Outpatient Encounter Prescriptions as of 11/14/2015  Medication Sig  . oxyCODONE-acetaminophen (PERCOCET) 7.5-325 MG tablet Take 1 tablet by mouth every 6 (six) hours as needed for moderate pain or severe pain (Must last 30 days.Do not take and drive a car or operate machinery).  Marland Kitchen azithromycin (ZITHROMAX) 250 MG tablet tad  .  chlorpheniramine-HYDROcodone (TUSSIONEX PENNKINETIC ER) 10-8 MG/5ML SUER Take 5 mLs by mouth 2 (two) times daily.  . [DISCONTINUED] chlorpheniramine-HYDROcodone (TUSSIONEX PENNKINETIC ER) 10-8 MG/5ML SUER Take 5 mLs by mouth 2 (two) times daily.  . [DISCONTINUED] predniSONE (DELTASONE) 10 MG tablet Take 2 tablets (20 mg total) by mouth 2 (two) times daily with a meal.   No facility-administered encounter medications on file as of 11/14/2015.     Past Medical History:  Diagnosis Date  . Allergy   . GERD (gastroesophageal reflux disease)   . Migraines     Past Surgical History:  Procedure Laterality Date  . APPENDECTOMY  09/2010  . CARPAL TUNNEL RELEASE Right 03/09/2013   Procedure: RIGHT CARPAL TUNNEL RELEASE OPEN;  Surgeon: Darreld Mclean, MD;  Location: [facility];  Service: Orthopedics;  Laterality: Right;  . CARPAL TUNNEL RELEASE Left 11/30/2013   Procedure: CARPAL TUNNEL RELEASE;  Surgeon: Darreld Mclean, MD;  Location: [facility];  Service: Orthopedics;  Laterality: Left;  . CHOLECYSTECTOMY  11/07/2010   Procedure: LAPAROSCOPIC CHOLECYSTECTOMY;  Surgeon: Fabio Bering;  Location: [facility];  Service: General;  Laterality: N/A;  . COLONOSCOPY  10/23/2010   Procedure: COLONOSCOPY;  Surgeon: Dalia Heading;  [facility];  Service: Gastroenterology;  Laterality: N/A;  . LAPAROSCOPIC APPENDECTOMY  10/08/2010   Procedure: APPENDECTOMY LAPAROSCOPIC;  Surgeon: Fabio Bering;  Location: [facility];  Service: General;  Laterality: N/A;  . LAPAROSCOPY  10/08/2010   Procedure: LAPAROSCOPY DIAGNOSTIC;  Surgeon: Fabio BeringBrent C Ziegler;  Location: [facility];  Service: General;  Laterality: N/A;    Social History   Social History  . Marital status: Married    Spouse name: Lauris Poagmelia  . Number of children: 3  . Years of education: 12   Occupational History  . facility technician     [employer]   Social History Main Topics  . Smoking status: Current Every Day Smoker     Packs/day: 1.00    Years: 7.00    Types: Cigarettes    Last attempt to quit: 10/27/2010  . Smokeless tobacco: Current User    Types: Snuff  . Alcohol use Yes     Comment: occassional- 1 beer q 3 weeks  . Drug use: No  . Sexual activity: Yes    Birth control/ protection: None   Other Topics Concern  . Not on file   Social History Narrative   Lives with wife Lauris Poagmelia and 3 children at grandparents house    Family History  Problem Relation Age of Onset  . Diabetes Maternal Grandmother   . Heart disease Maternal Grandfather   . Hyperlipidemia Maternal Grandfather   . Hypertension Maternal Grandfather   . Anesthesia problems Neg Hx   . Hypotension Neg Hx   . Malignant hyperthermia Neg Hx   . Pseudochol deficiency Neg Hx     Review of Systems  Constitutional: Positive for chills and malaise/fatigue. Negative for fever and weight loss.  HENT: Positive for congestion and sore throat. Negative for hearing loss.   Eyes: Negative for blurred vision and pain.  Respiratory: Positive for cough and sputum production. Negative for shortness of breath and wheezing.   Cardiovascular: Negative for chest pain and leg swelling.  Gastrointestinal: Negative for abdominal pain, constipation, diarrhea and heartburn.  Genitourinary: Negative for dysuria and frequency.  Musculoskeletal: Negative for falls, joint pain and myalgias.  Neurological: Negative for dizziness, seizures and headaches.  Psychiatric/Behavioral: Negative for depression. The patient is not nervous/anxious and does not have insomnia.     BP (!) 142/88   Pulse 96   Resp 18   Ht 6' 0.25" (1.835 m)   Wt 286 lb (129.7 kg)   SpO2 98%   BMI 38.52 kg/m   Physical Exam  Constitutional: He is oriented to person, place, and time. He appears well-developed and well-nourished. No distress.  Voice is hoarse  HENT:  Head: Normocephalic and atraumatic.  Right Ear: External ear normal.  Left Ear: External ear normal.  Mouth/Throat:  Oropharynx is clear and moist.  Erythematous tonsils, no exudate. Sinuses tender. Nasal congestion.  Eyes: Conjunctivae are normal. Pupils are equal, round, and reactive to light.  Neck: Normal range of motion. Neck supple.  Cardiovascular: Normal rate, regular rhythm and normal heart sounds.   Pulmonary/Chest: Effort normal and breath sounds normal. He has no wheezes. He has no rales.  Abdominal: Soft. Bowel sounds are normal.  No hepatomegaly  Musculoskeletal: Normal range of motion. He exhibits no edema.  Lymphadenopathy:    He has no cervical adenopathy.  Neurological: He is alert and oriented to person, place, and time. He has normal reflexes.  Psychiatric: He has a normal mood and affect. His behavior is normal. Judgment and thought content normal.   ASSESSMENT/PLAN:   1. Tobacco abuse Counseled  2. Obesity Diet and exercise reviewed  3. Acute maxillary sinusitis, recurrence not specified Antibiotics and cough medicine, home care discussed  4. Encounter to establish care with new doctor  5. Elevated blood pressure Patient will return. Discussed diet and exercise weight loss and reducing salt   Patient Instructions  Rest push fluids Off work 2 days Take the z pak as instructed Take the tussionex for cough Use humidifier  Try to cut down or quit smoking  Need records Dr Selena Batten  See me in a month for a PE    Upper Respiratory Infection, Adult Most upper respiratory infections (URIs) are caused by a virus. A URI affects the nose, throat, and upper air passages. The most common type of URI is often called "the common cold." HOME CARE   Take medicines only as told by your doctor.  Gargle warm saltwater or take cough drops to comfort your throat as told by your doctor.  Use a warm mist humidifier or inhale steam from a shower to increase air moisture. This may make it easier to breathe.  Drink enough fluid to keep your pee (urine) clear or pale yellow.  Eat  soups and other clear broths.  Have a healthy diet.  Rest as needed.  Go back to work when your fever is gone or your doctor says it is okay.  You may need to stay home longer to avoid giving your URI to others.  You can also wear a face mask and wash your hands often to prevent spread of the virus.  Use your inhaler more if you have asthma.  Do not use any tobacco products, including cigarettes, chewing tobacco, or electronic cigarettes. If you need help quitting, ask your doctor. GET HELP IF:  You are getting worse, not better.  Your symptoms are not helped by medicine.  You have chills.  You are getting more short of breath.  You have brown or red mucus.  You have yellow or brown discharge from your nose.  You have pain in your face, especially when you bend forward.  You have a fever.  You have puffy (swollen) neck glands.  You have pain while swallowing.  You have white areas in the back of your throat. GET HELP RIGHT AWAY IF:   You have very bad or constant:  Headache.  Ear pain.  Pain in your forehead, behind your eyes, and over your cheekbones (sinus pain).  Chest pain.  You have long-lasting (chronic) lung disease and any of the following:  Wheezing.  Long-lasting cough.  Coughing up blood.  A change in your usual mucus.  You have a stiff neck.  You have changes in your:  Vision.  Hearing.  Thinking.  Mood. MAKE SURE YOU:   Understand these instructions.  Will watch your condition.  Will get help right away if you are not doing well or get worse.   This information is not intended to replace advice given to you by your health care provider. Make sure you discuss any questions you have with your health care provider.   Document Released: 07/31/2007 Document Revised: 06/28/2014 Document Reviewed: 05/19/2013 Elsevier Interactive Patient Education 2016 Elsevier Inc.    Eustace Moore, MD

## 2015-11-21 ENCOUNTER — Ambulatory Visit (INDEPENDENT_AMBULATORY_CARE_PROVIDER_SITE_OTHER): Payer: BLUE CROSS/BLUE SHIELD | Admitting: Orthopaedic Surgery

## 2015-11-21 ENCOUNTER — Encounter: Payer: Self-pay | Admitting: Orthopaedic Surgery

## 2015-11-21 VITALS — BP 129/86 | HR 80 | Temp 97.5°F | Ht 72.25 in | Wt 282.0 lb

## 2015-11-21 DIAGNOSIS — M542 Cervicalgia: Secondary | ICD-10-CM

## 2015-11-21 DIAGNOSIS — M25512 Pain in left shoulder: Secondary | ICD-10-CM

## 2015-11-21 NOTE — Progress Notes (Signed)
Patient UE:AVWUJWJXBJY:Shane Crawford, male DOB:[birth year], 30 y.o. NWG:[id number]  Chief Complaint  Patient presents with  . Follow-up    Left shoulder and neck pain    HPI  Shane Crawford is a 30 y.o. male who has chronic neck pain and left shoulder pain. He has had MRI, has been doing his exercises.  He has no weakness now.  He has no new trauma.  He has pain of the left upper trapezius.   HPI  Body mass index is 37.98 kg/m.  ROS  Review of Systems  HENT: Positive for congestion.   Respiratory: Negative for cough and shortness of breath.   Cardiovascular: Negative for chest pain and leg swelling.  Endocrine: Positive for cold intolerance.  Musculoskeletal: Positive for arthralgias and neck pain.  Allergic/Immunologic: Negative for environmental allergies.    Past Medical History:  Diagnosis Date  . Allergy   . GERD (gastroesophageal reflux disease)   . Migraines     Past Surgical History:  Procedure Laterality Date  . APPENDECTOMY  09/2010  . CARPAL TUNNEL RELEASE Right 03/09/2013   Procedure: RIGHT CARPAL TUNNEL RELEASE OPEN;  Surgeon: Darreld McleanWayne Shoni Quijas, MD;  Location: [facility];  Service: Orthopedics;  Laterality: Right;  . CARPAL TUNNEL RELEASE Left 11/30/2013   Procedure: CARPAL TUNNEL RELEASE;  Surgeon: Darreld McleanWayne Nylah Butkus, MD;  Location: [facility];  Service: Orthopedics;  Laterality: Left;  . CHOLECYSTECTOMY  11/07/2010   Procedure: LAPAROSCOPIC CHOLECYSTECTOMY;  Surgeon: Fabio BeringBrent C Ziegler;  Location: [facility];  Service: General;  Laterality: N/A;  . COLONOSCOPY  10/23/2010   Procedure: COLONOSCOPY;  Surgeon: Dalia HeadingMark A Jenkins;  Location: [facility];  Service: Gastroenterology;  Laterality: N/A;  . LAPAROSCOPIC APPENDECTOMY  10/08/2010   Procedure: APPENDECTOMY LAPAROSCOPIC;  Surgeon: Fabio BeringBrent C Ziegler;  Location: [facility];  Service: General;  Laterality: N/A;  . LAPAROSCOPY  10/08/2010   Procedure: LAPAROSCOPY DIAGNOSTIC;  Surgeon: Fabio BeringBrent C Ziegler;  Location: [facility];  Service: General;   Laterality: N/A;    Family History  Problem Relation Age of Onset  . Diabetes Maternal Grandmother   . Heart disease Maternal Grandfather   . Hyperlipidemia Maternal Grandfather   . Hypertension Maternal Grandfather   . Anesthesia problems Neg Hx   . Hypotension Neg Hx   . Malignant hyperthermia Neg Hx   . Pseudochol deficiency Neg Hx     Social History Social History  Substance Use Topics  . Smoking status: Current Every Day Smoker    Packs/day: 1.00    Years: 7.00    Types: Cigarettes    Last attempt to quit: 10/27/2010  . Smokeless tobacco: Current User    Types: Snuff  . Alcohol use Yes     Comment: occassional- 1 beer q 3 weeks    No Known Allergies  Current Outpatient Prescriptions  Medication Sig Dispense Refill  . azithromycin (ZITHROMAX) 250 MG tablet tad 6 tablet 0  . chlorpheniramine-HYDROcodone (TUSSIONEX PENNKINETIC ER) 10-8 MG/5ML SUER Take 5 mLs by mouth 2 (two) times daily. 115 mL 0  . oxyCODONE-acetaminophen (PERCOCET) 7.5-325 MG tablet Take 1 tablet by mouth every 6 (six) hours as needed for moderate pain or severe pain (Must last 30 days.Do not take and drive a car or operate machinery). 110 tablet 0   No current facility-administered medications for this visit.      Physical Exam  Blood pressure 129/86, pulse 80, temperature 97.5 F (36.4 C), height 6' 0.25" (1.835 m), weight 282 lb (127.9 kg).  Constitutional: overall normal hygiene,  normal nutrition, well developed, normal grooming, normal body habitus. Assistive device:none  Musculoskeletal: gait and station Limp none, muscle tone and strength are normal, no tremors or atrophy is present.  .  Neurological: coordination overall normal.  Deep tendon reflex/nerve stretch intact.  Sensation normal.  Cranial nerves II-XII intact.   Skin:   Normal overall no scars, lesions, ulcers or rashes. No psoriasis.  Psychiatric: Alert and oriented x 3.  Recent memory intact, remote memory unclear.  Normal  mood and affect. Well groomed.  Good eye contact.  Cardiovascular: overall no swelling, no varicosities, no edema bilaterally, normal temperatures of the legs and arms, no clubbing, cyanosis and good capillary refill.  Lymphatic: palpation is normal.  He has full motion of the neck.  He has pain of the left upper trapezius.  He has no spasm.  ROM of both shoulders are full with normal strength.  The patient has been educated about the nature of the problem(s) and counseled on treatment options.  The patient appeared to understand what I have discussed and is in agreement with it.  Encounter Diagnoses  Name Primary?  . Neck pain Yes  . Pain in joint of left shoulder     PLAN Call if any problems.  Precautions discussed.  Continue current medications.   Return to clinic 2 months   Electronically Signed Darreld Mclean, MD 9/26/20178:17 AM

## 2015-11-29 ENCOUNTER — Telehealth: Payer: Self-pay | Admitting: Orthopaedic Surgery

## 2015-11-29 NOTE — Telephone Encounter (Signed)
yes

## 2015-11-29 NOTE — Telephone Encounter (Signed)
Oxycodone-Acetaminophen 7.5/325mg   Qty 110 Tablets  Take 1 tablet by mouth every 6(six) hours as needed for moderate pain or severe pain (Must last 30 days. Do not take and drive a car or operate machinery).

## 2015-11-29 NOTE — Telephone Encounter (Signed)
Routing to Dr. Harrison to advise 

## 2015-12-04 ENCOUNTER — Other Ambulatory Visit: Payer: Self-pay | Admitting: *Deleted

## 2015-12-04 MED ORDER — OXYCODONE-ACETAMINOPHEN 7.5-325 MG PO TABS: 1.0000 | ORAL_TABLET | Freq: Four times a day (QID) | ORAL | 0 refills | Status: DC | PRN

## 2015-12-14 ENCOUNTER — Encounter: Payer: Self-pay | Admitting: Family Medicine

## 2015-12-14 ENCOUNTER — Ambulatory Visit (INDEPENDENT_AMBULATORY_CARE_PROVIDER_SITE_OTHER): Payer: BLUE CROSS/BLUE SHIELD | Admitting: Family Medicine

## 2015-12-14 VITALS — BP 136/88 | HR 84 | Temp 97.7°F | Resp 18 | Ht 72.25 in | Wt 287.0 lb

## 2015-12-14 DIAGNOSIS — E6609 Other obesity due to excess calories: Secondary | ICD-10-CM | POA: Diagnosis not present

## 2015-12-14 DIAGNOSIS — Z13220 Encounter for screening for lipoid disorders: Secondary | ICD-10-CM

## 2015-12-14 DIAGNOSIS — Z23 Encounter for immunization: Secondary | ICD-10-CM | POA: Diagnosis not present

## 2015-12-14 DIAGNOSIS — J0101 Acute recurrent maxillary sinusitis: Secondary | ICD-10-CM

## 2015-12-14 DIAGNOSIS — Z6838 Body mass index (BMI) 38.0-38.9, adult: Secondary | ICD-10-CM

## 2015-12-14 DIAGNOSIS — Z720 Tobacco use: Secondary | ICD-10-CM

## 2015-12-14 MED ORDER — HYDROCOD POLST-CPM POLST ER 10-8 MG/5ML PO SUER: 5.0000 mL | Freq: Two times a day (BID) | ORAL | 0 refills | Status: DC

## 2015-12-14 MED ORDER — FLUTICASONE PROPIONATE 50 MCG/ACT NA SUSP: 2.0000 | Freq: Every day | NASAL | 6 refills | Status: DC

## 2015-12-14 MED ORDER — AZITHROMYCIN 250 MG PO TABS: ORAL_TABLET | ORAL | 0 refills | Status: DC

## 2015-12-14 NOTE — Patient Instructions (Signed)
Drink plenty of fluids Take the z pak as instructed Cough medicine as needed Use nasal spray daily until symptoms clear  Continue to cut down on the smoking Also continue to reduce the pain pills I agree with your decision to do PT  Need fasting lab tests to screen for cholesterol and sugar  See me in a couple of months

## 2015-12-14 NOTE — Progress Notes (Signed)
Chief Complaint  Patient presents with  . Annual Exam   Here for follow up Declines a physical exam Wants to be seen for sinus infection instead He had a URI with sinus infection at his last visit as well, and I explained this is seen in smokers, and encouraged him again today to quit. He is still using percocet for pain management of his neck, but tells me he is trying to take fewer pills and make them last longer.  He also tells me that he plans to discuss with Dr Hilda Lias PT and alternate pain management methods. We discussed his weight,  He is up 5 lbs. He desires a flu shot   Patient Active Problem List   Diagnosis Date Noted  . Tobacco abuse 11/14/2015  . Obesity 11/14/2015  . Pain in joint of left shoulder 04/04/2015    Outpatient Encounter Prescriptions as of 12/14/2015  Medication Sig  . oxyCODONE-acetaminophen (PERCOCET) 7.5-325 MG tablet Take 1 tablet by mouth every 6 (six) hours as needed for moderate pain or severe pain (Must last 30 days.Do not take and drive a car or operate machinery).  Marland Kitchen azithromycin (ZITHROMAX) 250 MG tablet tad  . chlorpheniramine-HYDROcodone (TUSSIONEX PENNKINETIC ER) 10-8 MG/5ML SUER Take 5 mLs by mouth 2 (two) times daily.  . chlorpheniramine-HYDROcodone (TUSSIONEX PENNKINETIC ER) 10-8 MG/5ML SUER Take 5 mLs by mouth 2 (two) times daily.  . fluticasone (FLONASE) 50 MCG/ACT nasal spray Place 2 sprays into both nostrils daily.   No facility-administered encounter medications on file as of 12/14/2015.     No Known Allergies  Review of Systems  Constitutional: Positive for unexpected weight change. Negative for activity change, appetite change, diaphoresis and fever.  HENT: Positive for congestion, postnasal drip, rhinorrhea, sinus pressure and sore throat.   Eyes: Negative for redness and visual disturbance.  Respiratory: Positive for cough. Negative for shortness of breath and wheezing.   Cardiovascular: Negative for chest pain,  palpitations and leg swelling.  Gastrointestinal: Negative for constipation and diarrhea.  Genitourinary: Negative for difficulty urinating and frequency.  Musculoskeletal: Positive for neck pain and neck stiffness.  Neurological: Positive for headaches.  Psychiatric/Behavioral: Positive for sleep disturbance.    BP 136/88 (BP Location: Right Arm, Patient Position: Sitting, Cuff Size: Large)   Pulse 84   Temp 97.7 F (36.5 C) (Oral)   Resp 18   Ht 6' 0.25" (1.835 m)   Wt 287 lb (130.2 kg)   SpO2 99%   BMI 38.66 kg/m   Physical Exam  Constitutional: He is oriented to person, place, and time. He appears well-developed and well-nourished.  HENT:  Head: Normocephalic and atraumatic.  Right Ear: External ear normal.  Left Ear: External ear normal.  Post pharynx injected.  sinuses tender.  Yellow drainage  Eyes: Conjunctivae are normal. Pupils are equal, round, and reactive to light.  Neck: Normal range of motion. Neck supple. No thyromegaly present.  Tender cerv nodes  Cardiovascular: Normal rate, regular rhythm and normal heart sounds.   Pulmonary/Chest: Effort normal and breath sounds normal. No respiratory distress. He has no wheezes.  Abdominal: Soft. Bowel sounds are normal.  Musculoskeletal: Normal range of motion. He exhibits no edema.  Lymphadenopathy:    He has cervical adenopathy.  Neurological: He is alert and oriented to person, place, and time.  Gait normal  Skin: Skin is warm and dry.  Psychiatric: He has a normal mood and affect. His behavior is normal. Thought content normal.  Nursing note and vitals reviewed.  ASSESSMENT/PLAN:  1. Need for prophylactic vaccination and inoculation against influenza  - Flu Vaccine QUAD 36+ mos IM  2. Acute recurrent maxillary sinusitis   3. Tobacco abuse discussed  4. Class 2 obesity due to excess calories without serious comorbidity with body mass index (BMI) of 38.0 to 38.9 in adult  - BASIC METABOLIC PANEL WITH  GFR  5. Screening for lipid disorders  - Lipid panel   Patient Instructions  Drink plenty of fluids Take the z pak as instructed Cough medicine as needed Use nasal spray daily until symptoms clear  Continue to cut down on the smoking Also continue to reduce the pain pills I agree with your decision to do PT  Need fasting lab tests to screen for cholesterol and sugar  See me in a couple of months       Eustace MooreYvonne Sue Nelson, MD

## 2015-12-18 ENCOUNTER — Telehealth: Payer: Self-pay

## 2015-12-18 NOTE — Telephone Encounter (Signed)
Patient continue prescribed medication and if he is no better by the end of the week he should call back for an appointment.   Patient did go to work after visit and missed the night of 10/22.  Is aware that excuse cannot be given.

## 2016-01-02 ENCOUNTER — Telehealth: Payer: Self-pay | Admitting: Orthopaedic Surgery

## 2016-01-02 MED ORDER — OXYCODONE-ACETAMINOPHEN 7.5-325 MG PO TABS: 1.0000 | ORAL_TABLET | Freq: Four times a day (QID) | ORAL | 0 refills | Status: DC | PRN

## 2016-01-02 NOTE — Telephone Encounter (Signed)
Patient called for refill:  oxyCODONE-acetaminophen (PERCOCET) 7.5-325 MG tablet 110 tablet   - insurance: [employer]

## 2016-01-23 ENCOUNTER — Ambulatory Visit (INDEPENDENT_AMBULATORY_CARE_PROVIDER_SITE_OTHER): Payer: BLUE CROSS/BLUE SHIELD | Admitting: Orthopaedic Surgery

## 2016-01-23 ENCOUNTER — Ambulatory Visit: Payer: Self-pay | Admitting: Orthopaedic Surgery

## 2016-01-23 VITALS — BP 142/90 | HR 92 | Temp 97.7°F | Ht 74.5 in | Wt 285.0 lb

## 2016-01-23 DIAGNOSIS — M25512 Pain in left shoulder: Secondary | ICD-10-CM | POA: Diagnosis not present

## 2016-01-23 DIAGNOSIS — M5090 Cervical disc disorder, unspecified, unspecified cervical region: Secondary | ICD-10-CM

## 2016-01-23 MED ORDER — NAPROXEN 500 MG PO TABS: 500.0000 mg | ORAL_TABLET | Freq: Two times a day (BID) | ORAL | 5 refills | Status: DC

## 2016-01-23 MED ORDER — TRAMADOL HCL 50 MG PO TABS: 50.0000 mg | ORAL_TABLET | Freq: Four times a day (QID) | ORAL | 3 refills | Status: DC | PRN

## 2016-01-23 NOTE — Progress Notes (Signed)
Patient WU:[id number]:Shane Crawford, male DOB:09/24/1985, 30 y.o. FAO:[id number]  Chief Complaint  Patient presents with  . Follow-up    Neck pain    HPI  Shane Crawford is a 30 y.o. male who has chronic neck and left shoulder pain.  He has had increased pain with the change in weather.  I will begin PT and OT for him.  He has no new trauma, no paresthesias. HPI  Body mass index is 36.1 kg/m.  ROS  Review of Systems  HENT: Positive for congestion.   Respiratory: Negative for cough and shortness of breath.   Cardiovascular: Negative for chest pain and leg swelling.  Endocrine: Positive for cold intolerance.  Musculoskeletal: Positive for arthralgias and neck pain.  Allergic/Immunologic: Negative for environmental allergies.    Past Medical History:  Diagnosis Date  . Allergy   . GERD (gastroesophageal reflux disease)   . Migraines   . Tobacco abuse     Past Surgical History:  Procedure Laterality Date  . APPENDECTOMY  09/2010  . CARPAL TUNNEL RELEASE Right 03/09/2013   Procedure: RIGHT CARPAL TUNNEL RELEASE OPEN;  Surgeon: Darreld McleanWayne Camp Gopal, MD;  Location: [facility];  Service: Orthopedics;  Laterality: Right;  . CARPAL TUNNEL RELEASE Left 11/30/2013   Procedure: CARPAL TUNNEL RELEASE;  Surgeon: Darreld McleanWayne Lonzell Dorris, MD;  Location: [facility];  Service: Orthopedics;  Laterality: Left;  . CHOLECYSTECTOMY  11/07/2010   Procedure: LAPAROSCOPIC CHOLECYSTECTOMY;  Surgeon: Fabio BeringBrent C Ziegler;  Location: [facility];  Service: General;  Laterality: N/A;  . COLONOSCOPY  10/23/2010   Procedure: COLONOSCOPY;  Surgeon: Dalia HeadingMark A Jenkins;  Location: [facility];  Service: Gastroenterology;  Laterality: N/A;  . LAPAROSCOPIC APPENDECTOMY  10/08/2010   Procedure: APPENDECTOMY LAPAROSCOPIC;  Surgeon: Fabio BeringBrent C Ziegler;  Location: [facility];  Service: General;  Laterality: N/A;  . LAPAROSCOPY  10/08/2010   Procedure: LAPAROSCOPY DIAGNOSTIC;  Surgeon: Fabio BeringBrent C Ziegler;  Location: [facility];  Service: General;  Laterality: N/A;     Family History  Problem Relation Age of Onset  . Diabetes Maternal Grandmother   . Heart disease Maternal Grandfather   . Hyperlipidemia Maternal Grandfather   . Hypertension Maternal Grandfather   . Anesthesia problems Neg Hx   . Hypotension Neg Hx   . Malignant hyperthermia Neg Hx   . Pseudochol deficiency Neg Hx     Social History Social History  Substance Use Topics  . Smoking status: Current Every Day Smoker    Packs/day: 1.00    Years: 7.00    Types: Cigarettes  . Smokeless tobacco: Current User    Types: Snuff  . Alcohol use Yes     Comment: occassional- 1 beer q 3 weeks    No Known Allergies  Current Outpatient Prescriptions  Medication Sig Dispense Refill  . azithromycin (ZITHROMAX) 250 MG tablet tad 6 tablet 0  . chlorpheniramine-HYDROcodone (TUSSIONEX PENNKINETIC ER) 10-8 MG/5ML SUER Take 5 mLs by mouth 2 (two) times daily. 115 mL 0  . chlorpheniramine-HYDROcodone (TUSSIONEX PENNKINETIC ER) 10-8 MG/5ML SUER Take 5 mLs by mouth 2 (two) times daily. 115 mL 0  . fluticasone (FLONASE) 50 MCG/ACT nasal spray Place 2 sprays into both nostrils daily. 16 g 6  . naproxen (NAPROSYN) 500 MG tablet Take 1 tablet (500 mg total) by mouth 2 (two) times daily with a meal. 60 tablet 5  . oxyCODONE-acetaminophen (PERCOCET) 7.5-325 MG tablet Take 1 tablet by mouth every 6 (six) hours as needed for moderate pain or severe pain (Must last 30 days.Do not  take and drive a car or operate machinery). 110 tablet 0  . traMADol (ULTRAM) 50 MG tablet Take 1 tablet (50 mg total) by mouth every 6 (six) hours as needed. 60 tablet 3   No current facility-administered medications for this visit.      Physical Exam  Blood pressure (!) 142/90, pulse 92, temperature 97.7 F (36.5 C), height 6' 2.5" (1.892 m), weight 285 lb (129.3 kg).  Constitutional: overall normal hygiene, normal nutrition, well developed, normal grooming, normal body habitus. Assistive device:none  Musculoskeletal:  gait and station Limp none, muscle tone and strength are normal, no tremors or atrophy is present.  .  Neurological: coordination overall normal.  Deep tendon reflex/nerve stretch intact.  Sensation normal.  Cranial nerves II-XII intact.   Skin:   Normal overall no scars, lesions, ulcers or rashes. No psoriasis.  Psychiatric: Alert and oriented x 3.  Recent memory intact, remote memory unclear.  Normal mood and affect. Well groomed.  Good eye contact.  Cardiovascular: overall no swelling, no varicosities, no edema bilaterally, normal temperatures of the legs and arms, no clubbing, cyanosis and good capillary refill.  Lymphatic: palpation is normal.  Examination of left Upper Extremity is done.  Inspection:   Overall:  Elbow non-tender without crepitus or defects, forearm non-tender without crepitus or defects, wrist non-tender without crepitus or defects, hand non-tender.    Shoulder: with glenohumeral joint tenderness, without effusion.   Upper arm: without swelling and tenderness   Range of motion:   Overall:  Full range of motion of the elbow, full range of motion of wrist and full range of motion in fingers.   Shoulder:  left  165 degrees forward flexion; 145 degrees abduction; 35 degrees internal rotation, 35 degrees external rotation, 15 degrees extension, 40 degrees adduction.   Stability:   Overall:  Shoulder, elbow and wrist stable   Strength and Tone:   Overall full shoulder muscles strength, full upper arm strength and normal upper arm bulk and tone.   The patient has been educated about the nature of the problem(s) and counseled on treatment options.  The patient appeared to understand what I have discussed and is in agreement with it.  Encounter Diagnoses  Name Primary?  . Cervical neck pain with evidence of disc disease Yes  . Pain in joint of left shoulder     PLAN Call if any problems.  Precautions discussed.  Continue current medications.   Return to clinic 3  weeks   Electronically Signed Darreld McleanWayne Stefhanie Kachmar, MD 11/28/20178:44 AM

## 2016-02-01 ENCOUNTER — Telehealth: Payer: Self-pay | Admitting: Orthopaedic Surgery

## 2016-02-01 MED ORDER — OXYCODONE-ACETAMINOPHEN 7.5-325 MG PO TABS: 1.0000 | ORAL_TABLET | Freq: Four times a day (QID) | ORAL | 0 refills | Status: DC | PRN

## 2016-02-01 NOTE — Telephone Encounter (Signed)
Patient requests refill:  oxyCODONE-acetaminophen (PERCOCET) 7.5-325 MG tablet 110 tablet

## 2016-02-02 ENCOUNTER — Telehealth (HOSPITAL_COMMUNITY): Payer: Self-pay

## 2016-02-02 ENCOUNTER — Ambulatory Visit (HOSPITAL_COMMUNITY): Payer: BLUE CROSS/BLUE SHIELD

## 2016-02-02 NOTE — Telephone Encounter (Signed)
His Grandmother died this morning and they are on their way out ot town, reschedule for next week on Wed

## 2016-02-07 ENCOUNTER — Encounter (HOSPITAL_COMMUNITY): Payer: Self-pay | Admitting: Occupational Therapy

## 2016-02-07 ENCOUNTER — Ambulatory Visit (HOSPITAL_COMMUNITY): Payer: BLUE CROSS/BLUE SHIELD | Attending: Orthopaedic Surgery | Admitting: Occupational Therapy

## 2016-02-07 DIAGNOSIS — M25512 Pain in left shoulder: Secondary | ICD-10-CM | POA: Insufficient documentation

## 2016-02-07 DIAGNOSIS — M542 Cervicalgia: Secondary | ICD-10-CM | POA: Diagnosis present

## 2016-02-07 DIAGNOSIS — R29898 Other symptoms and signs involving the musculoskeletal system: Secondary | ICD-10-CM | POA: Insufficient documentation

## 2016-02-07 DIAGNOSIS — G8929 Other chronic pain: Secondary | ICD-10-CM | POA: Insufficient documentation

## 2016-02-07 NOTE — Patient Instructions (Signed)
Cervical A/ROM Exercises   AROM: Lateral Neck Flexion   Slowly tilt head toward one shoulder, then the other. Hold each position _3-5___ seconds. Repeat __10-15__ times per set. Do _1___ sets per session. Do _1-2___ sessions per day.  http://orth.exer.us/296   Copyright  VHI. All rights reserved.  AROM: Neck Extension   Bend head backward. Hold __3-5__ seconds. Repeat _10-15___ times per set. Do __1__ sets per session. Do _1-2___ sessions per day.  http://orth.exer.us/300   Copyright  VHI. All rights reserved.  AROM: Neck Flexion   Bend head forward. Hold __3-5__ seconds. Repeat _10-15___ times per set. Do _1___ sets per session. Do __1-2__ sessions per day.  http://orth.exer.us/298   Copyright  VHI. All rights reserved.  AROM: Neck Rotation   Turn head slowly to look over one shoulder, then the other. Hold each position __3-5__ seconds. Repeat _10-15___ times per set. Do _1___ sets per session. Do _1-2___ sessions per day.  http://orth.exer.us/294   Copyright  VHI. All rights reserved.      1) Flexion Wall Stretch    Face wall, place affected handon wall in front of you. Slide hand up the wall  and lean body in towards the wall. Hold for 5 seconds. Repeat 5 times. 1-2 times/day.

## 2016-02-07 NOTE — Therapy (Signed)
[place] [facility] [address] [place] [place], [place], [address] Phone: [phone]   Fax:  [phone]  Occupational Therapy Evaluation  Patient Details  Name: Shane CarawayChristopher M Crawford MRN: [id number] Date of Birth: 1985/10/02 Referring Provider: Dr. Darreld McleanWayne Keeling  Encounter Date: 02/07/2016      OT End of Session - 02/07/16 1355    Visit Number 1   Number of Visits 8   Date for OT Re-Evaluation 03/15/16   Authorization Type BCBS   Authorization Time Period 30 visit limit-none used   Authorization - Visit Number 1   Authorization - Number of Visits 30   OT Start Time 1305   OT Stop Time 1348   OT Time Calculation (min) 43 min   Activity Tolerance Patient tolerated treatment well   Behavior During Therapy [facility] for tasks assessed/performed      Past Medical History:  Diagnosis Date  . Allergy   . GERD (gastroesophageal reflux disease)   . Migraines   . Tobacco abuse     Past Surgical History:  Procedure Laterality Date  . APPENDECTOMY  09/2010  . CARPAL TUNNEL RELEASE Right 03/09/2013   Procedure: RIGHT CARPAL TUNNEL RELEASE OPEN;  Surgeon: Darreld McleanWayne Keeling, MD;  Location: [facility];  Service: Orthopedics;  Laterality: Right;  . CARPAL TUNNEL RELEASE Left 11/30/2013   Procedure: CARPAL TUNNEL RELEASE;  Surgeon: Darreld McleanWayne Keeling, MD;  Location: [facility];  Service: Orthopedics;  Laterality: Left;  . CHOLECYSTECTOMY  11/07/2010   Procedure: LAPAROSCOPIC CHOLECYSTECTOMY;  Surgeon: Fabio BeringBrent C Ziegler;  Location: [facility];  Service: General;  Laterality: N/A;  . COLONOSCOPY  10/23/2010   Procedure: COLONOSCOPY;  Surgeon: Dalia HeadingMark A Jenkins;  [facility];  Service: Gastroenterology;  Laterality: N/A;  . LAPAROSCOPIC APPENDECTOMY  10/08/2010   Procedure: APPENDECTOMY LAPAROSCOPIC;  Surgeon: Fabio BeringBrent C Ziegler;  Location: [facility];  Service: General;  Laterality: N/A;  . LAPAROSCOPY  10/08/2010   Procedure: LAPAROSCOPY DIAGNOSTIC;  Surgeon: Fabio BeringBrent C Ziegler;   Location: [facility];  Service: General;  Laterality: N/A;    There were no vitals filed for this visit.      Subjective Assessment - 02/07/16 1350    Subjective  S: I will try anything to help this pain.    Pertinent History Pt is a 30 y/o male presenting with chronic neck and left shoulder pain present for approximately 2 years. Pt has had MRI which shows bone spurs and minimal disc bulge at C5-6. Pt was referred to occupational therapy by Dr. Darreld McleanWayne Keeling.    Special Tests FOTO Score: 53/100 (47% impairment)   Patient Stated Goals To have less pain and be able to move my neck.    Currently in Pain? Yes   Pain Score 4    Pain Location Neck   Pain Orientation Left   Pain Descriptors / Indicators Aching   Pain Type Chronic pain   Pain Radiating Towards shoulder   Pain Onset More than a month ago   Pain Frequency Constant   Aggravating Factors  movement   Pain Relieving Factors heat, pain medications   Effect of Pain on Daily Activities mod effect on functional task completion   Multiple Pain Sites No           [facility] OT Assessment - 02/07/16 1303      Assessment   Diagnosis left shoulder pain; cervical neck pain   Referring Provider Dr. Darreld McleanWayne Keeling   Onset Date --  chronic-2 years ago   Prior  Therapy None     Precautions   Precautions None     Balance Screen   Has the patient fallen in the past 6 months Yes   How many times? 2   Has the patient had a decrease in activity level because of a fear of falling?  No   Is the patient reluctant to leave their home because of a fear of falling?  No     Prior Function   Level of Independence Independent   Leisure Curatormechanic, cutting firewood, farmwork-cows/donkeys/pigs/chickens     ADL   ADL comments Pt is having difficulty with turning head, pain with sitting straight up in chair, holding head up with Curatormechanic work, minimal pain with reaching behind back, heavy lifting is difficult     Written Expression   Dominant Hand  Right     Cognition   Overall Cognitive Status Within Functional Limits for tasks assessed     ROM / Strength   AROM / PROM / Strength AROM;PROM;Strength     Palpation   Palpation comment Pt with max fascial restrictions in left upper arm, trapezius, scapularis regions, and cervical region     AROM   Overall AROM Comments Assessed seated, IR/er adducted   AROM Assessment Site Shoulder;Cervical   Right/Left Shoulder Left   Left Shoulder Flexion 135 Degrees   Left Shoulder ABduction 126 Degrees   Left Shoulder Internal Rotation 90 Degrees   Left Shoulder External Rotation 38 Degrees   Cervical Flexion 50   Cervical Extension 60   Cervical - Right Side Bend 40   Cervical - Left Side Bend 33   Cervical - Right Rotation 60   Cervical - Left Rotation 38     PROM   Overall PROM Comments Assessed supine, er/IR adducted   PROM Assessment Site Shoulder   Right/Left Shoulder Left   Left Shoulder Flexion 170 Degrees   Left Shoulder ABduction 160 Degrees   Left Shoulder Internal Rotation 90 Degrees   Left Shoulder External Rotation 59 Degrees     Strength   Overall Strength Comments Assessed seated, er/IR adducted   Strength Assessment Site Shoulder   Right/Left Shoulder Left   Left Shoulder Flexion 5/5   Left Shoulder ABduction 5/5   Left Shoulder Internal Rotation 5/5   Left Shoulder External Rotation 4+/5                         OT Education - 02/07/16 1340    Education provided Yes   Education Details cervical A/ROM and flexion shoulder stretch   Person(s) Educated Patient   Methods Explanation;Demonstration;Handout   Comprehension Verbalized understanding;Returned demonstration          OT Short Term Goals - 02/07/16 1401      OT SHORT TERM GOAL #1   Title Pt will be provided with and educated on HEP.    Time 4   Period Weeks   Status New     OT SHORT TERM GOAL #2   Title Pt will return to highest level of independence and functioning during  ADL and IADL task completion.    Time 4   Period Weeks   Status New     OT SHORT TERM GOAL #3   Title Pt will decrease pain in cervical neck and LUE to 3/10 to improve ability to complete ADL tasks.    Time 4   Period Weeks   Status New     OT SHORT  TERM GOAL #4   Title Pt will decrease fascial restrictions in cervical and left shoulder regions from max to moderate amounts to improve mobility required for turning head while driving.    Time 4   Period Weeks   Status New     OT SHORT TERM GOAL #5   Title Pt will improve LUE A/ROM to [facility] to improve ability to perform overhead reaching tasks.    Time 4   Period Weeks   Status New     Additional Short Term Goals   Additional Short Term Goals Yes     OT SHORT TERM GOAL #6   Title Pt will improve cervical neck A/ROM to [facility] to improve ability to perform work tasks.    Time 4   Period Weeks   Status New                  Plan - 02/07/16 1356    Clinical Impression Statement A: Pt is a 30 y/o male presenting with chronic cervical neck pain and left shoulder pain limiting ADL, leisure, and IADL completion at highest level of independence and functioning. Pt reports limited neck ROM as his primary deficit, noting that his muscles fatigue easily with cervical extension and begin to shake.    Rehab Potential Good   OT Frequency 2x / week   OT Duration 4 weeks   OT Treatment/Interventions Self-care/ADL training;Therapeutic exercise;Patient/family education;Ultrasound;Manual Therapy;Therapeutic activities;Cryotherapy;Electrical Stimulation;Passive range of motion;Moist Heat;Contrast Bath   Plan P: Pt will benefit from skilled OT services to decrease pain and fascial restrictions, increase shoulder and cervical neck ROM to improve functional task completion during ADL, work, and leisure tasks. Treatment plan: Myofascial release, manual therapy, cervical neck and shoulder P/ROM, A/ROM, general strengthening, cervical stretches,  modalities prn   OT Home Exercise Plan 12/13: Cervical A/ROM, shoulder flexion wall stretch   Consulted and Agree with Plan of Care Patient      Patient will benefit from skilled therapeutic intervention in order to improve the following deficits and impairments:  Decreased strength, Impaired flexibility, Pain, Decreased range of motion, Increased fascial restricitons, Impaired UE functional use  Visit Diagnosis: Cervicalgia  Chronic left shoulder pain  Other symptoms and signs involving the musculoskeletal system    Problem List Patient Active Problem List   Diagnosis Date Noted  . Tobacco abuse 11/14/2015  . Obesity 11/14/2015  . Pain in joint of left shoulder 04/04/2015   Ezra Sites, OTR/L  [phone] 02/07/2016, 2:11 PM  [place] [facility] [address] [place], [place], [address] Phone: [phone]   Fax:  [phone]  Name: Shane Crawford MRN: [id number] Date of Birth: November 10, 1985

## 2016-02-08 ENCOUNTER — Telehealth: Payer: Self-pay | Admitting: Family Medicine

## 2016-02-08 NOTE — Telephone Encounter (Signed)
Dr. Nelson on PAL needs to reschedule apt °

## 2016-02-13 ENCOUNTER — Ambulatory Visit: Payer: Self-pay | Admitting: Family Medicine

## 2016-02-13 ENCOUNTER — Ambulatory Visit: Payer: Self-pay | Admitting: Orthopaedic Surgery

## 2016-02-14 ENCOUNTER — Telehealth (HOSPITAL_COMMUNITY): Payer: Self-pay | Admitting: Family Medicine

## 2016-02-14 ENCOUNTER — Ambulatory Visit (HOSPITAL_COMMUNITY): Payer: BLUE CROSS/BLUE SHIELD | Admitting: Specialist

## 2016-02-14 NOTE — Telephone Encounter (Signed)
02/14/16 pt cx because he said that his wife is pregnant and has a drs appt today and he needs to go with her to that

## 2016-02-22 ENCOUNTER — Encounter (HOSPITAL_COMMUNITY): Payer: Self-pay | Admitting: Occupational Therapy

## 2016-02-22 ENCOUNTER — Ambulatory Visit (HOSPITAL_COMMUNITY): Payer: BLUE CROSS/BLUE SHIELD | Admitting: Occupational Therapy

## 2016-02-22 DIAGNOSIS — G8929 Other chronic pain: Secondary | ICD-10-CM

## 2016-02-22 DIAGNOSIS — R29898 Other symptoms and signs involving the musculoskeletal system: Secondary | ICD-10-CM

## 2016-02-22 DIAGNOSIS — M25512 Pain in left shoulder: Secondary | ICD-10-CM

## 2016-02-22 DIAGNOSIS — M542 Cervicalgia: Secondary | ICD-10-CM

## 2016-02-22 NOTE — Therapy (Addendum)
[place], [place], [address] Phone: [phone]   Fax:  [phone]  Occupational Therapy Treatment  Patient Details  Name: Shane Crawford MRN: [id number] Date of Birth: [birth year] Referring Provider: Dr. Sanjuana Kava  Encounter Date: 02/22/2016      OT End of Session - 02/22/16 1613    Visit Number 2   Number of Visits 8   Date for OT Re-Evaluation 03/15/16   Authorization Type BCBS   Authorization Time Period 30 visit limit-none used   Authorization - Visit Number 2   Authorization - Number of Visits 30   OT Start Time 4431   OT Stop Time 1430   OT Time Calculation (min) 45 min   Activity Tolerance Patient tolerated treatment well   Behavior During Therapy [facility] for tasks assessed/performed      Past Medical History:  Diagnosis Date  . Allergy   . GERD (gastroesophageal reflux disease)   . Migraines   . Tobacco abuse     Past Surgical History:  Procedure Laterality Date  . APPENDECTOMY  09/2010  . CARPAL TUNNEL RELEASE Right 03/09/2013   Procedure: RIGHT CARPAL TUNNEL RELEASE OPEN;  Surgeon: Sanjuana Kava, MD;  [facility]: [facility];  Service: Orthopedics;  Laterality: Right;  . CARPAL TUNNEL RELEASE Left 11/30/2013   Procedure: CARPAL TUNNEL RELEASE;  Surgeon: Sanjuana Kava, MD;  Location: [facility];  Service: Orthopedics;  Laterality: Left;  . CHOLECYSTECTOMY  11/07/2010   Procedure: LAPAROSCOPIC CHOLECYSTECTOMY;  Surgeon: Donato Heinz;  Location: [facility];  Service: General;  Laterality: N/A;  . COLONOSCOPY  10/23/2010   Procedure: COLONOSCOPY;  Surgeon: Jamesetta So;  [facility];  Service: Gastroenterology;  Laterality: N/A;  . LAPAROSCOPIC APPENDECTOMY  10/08/2010   Procedure: APPENDECTOMY LAPAROSCOPIC;  Surgeon: Donato Heinz;  Location: [facility];  Service: General;  Laterality: N/A;  . LAPAROSCOPY  10/08/2010   Procedure: LAPAROSCOPY DIAGNOSTIC;  Surgeon: Donato Heinz;  Location:  [facility];  Service: General;  Laterality: N/A;    There were no vitals filed for this visit.      Subjective Assessment - 02/22/16 1343    Subjective  S: It woke me up last night, almost like I'd been hit with a bat on my neck.    Currently in Pain? Yes   Pain Score 6    Pain Location Shoulder   Pain Orientation Left   Pain Descriptors / Indicators Aching   Pain Type Chronic pain   Pain Radiating Towards shoulder   Pain Onset More than a month ago   Pain Frequency Constant   Aggravating Factors  movement, wrong positioning   Pain Relieving Factors heat, pain medications   Effect of Pain on Daily Activities mod effect on functional task completion   Multiple Pain Sites No            [facility] OT Assessment - 02/22/16 1342      Assessment   Diagnosis left shoulder pain; cervical neck pain     Precautions   Precautions None                  OT Treatments/Exercises (OP) - 02/22/16 1349      Exercises   Exercises Shoulder;Neck     Neck Exercises: Theraband   Scapula Retraction 10 reps   Scapula Retraction Limitations Pt with mod/max difficulty, required tactile facilitation to initiate   Rows 10 reps   Rows Limitations mod difficulty, tactile  facilitation for correct completion     Neck Exercises: Seated   Cervical Isometrics Flexion;Extension;Right lateral flexion;Left lateral flexion;Right rotation;Left rotation;5 secs  3 reps each   Shoulder Rolls Backwards;5 reps;Forwards;10 reps   Shoulder Rolls Limitations Pt reports severe pain with backwards rolls at 5 repetitions, stopped exercise with pain report     Neck Exercises: Supine   Cervical Rotation Both;5 reps   Cervical Rotation Limitations increased pain to right   Lateral Flexion Both;5 reps   Lateral Flexion Limitations increased pain to right     Shoulder Exercises: Supine   Protraction PROM;10 reps   Horizontal ABduction PROM;10 reps   External Rotation PROM;10 reps   Internal Rotation PROM;10  reps   Flexion PROM;10 reps   ABduction PROM;10 reps     Shoulder Exercises: Seated   Elevation AROM;10 reps   Elevation Limitations mod difficulty      Shoulder Exercises: ROM/Strengthening   Prot/Ret//Elev/Dep 10X; verbal and tactile cuing for correct form/technique     Manual Therapy   Manual Therapy Myofascial release;Manual Traction   Manual therapy comments Manual therapy completed separately from all therapeutic exercises   Myofascial Release Myofascial release to left upper arm, trapezius, and scapularis regions as well as cervical neck to reduce pain and fascial restrictions and increase joint range of motion.    Manual Traction Gentle manual traction complete to cervical neck to decrease pain and increase joint range of motion                OT Education - 02/22/16 1613    Education provided Yes   Education Details cervical isometrics-flexion, extension, lateral flexion, rotation   Person(s) Educated Patient   Methods Explanation;Demonstration;Handout   Comprehension Verbalized understanding;Returned demonstration          OT Short Term Goals - 02/22/16 1616      OT SHORT TERM GOAL #1   Title Pt will be provided with and educated on HEP.    Time 4   Period Weeks   Status On-going     OT SHORT TERM GOAL #2   Title Pt will return to highest level of independence and functioning during ADL and IADL task completion.    Time 4   Period Weeks   Status On-going     OT SHORT TERM GOAL #3   Title Pt will decrease pain in cervical neck and LUE to 3/10 to improve ability to complete ADL tasks.    Time 4   Period Weeks   Status On-going     OT SHORT TERM GOAL #4   Title Pt will decrease fascial restrictions in cervical and left shoulder regions from max to moderate amounts to improve mobility required for turning head while driving.    Time 4   Period Weeks   Status On-going     OT SHORT TERM GOAL #5   Title Pt will improve LUE A/ROM to [facility] to improve  ability to perform overhead reaching tasks.    Time 4   Period Weeks   Status On-going     OT SHORT TERM GOAL #6   Title Pt will improve cervical neck A/ROM to [facility] to improve ability to perform work tasks.    Time 4   Period Weeks   Status On-going                  Plan - 02/22/16 1613    Clinical Impression Statement A: Initiated myofascial release to left shoulder and cervical  regions this date, trigger points noted at upper trapezius and right lateral cervical regions. Pt with max difficulty with scapular mobility exercises, verbal and tactile cuing required. Pt reports weakness when holding his head in certain positions, namely extension; cervical isometrics completed with minimal discomfort reported. Pt provided with updated HEP for cervical isometrics.    Plan P: Continue with manual therapy, focus on posture during exercises and improving scapular mobility. Follow up on cervical neck isometrics HEP   OT Home Exercise Plan 12/13: Cervical A/ROM, shoulder flexion wall stretch   Consulted and Agree with Plan of Care Patient      Patient will benefit from skilled therapeutic intervention in order to improve the following deficits and impairments:  Decreased strength, Impaired flexibility, Pain, Decreased range of motion, Increased fascial restricitons, Impaired UE functional use  Visit Diagnosis: Cervicalgia  Chronic left shoulder pain  Other symptoms and signs involving the musculoskeletal system    Problem List Patient Active Problem List   Diagnosis Date Noted  . Tobacco abuse 11/14/2015  . Obesity 11/14/2015  . Pain in joint of left shoulder 04/04/2015   Guadelupe Sabin, OTR/L  [phone] 02/22/2016, 4:18 PM  [place] [address] [place], [place], [address] Phone: [phone]   Fax:  [phone]  Name: RODNEY YERA MRN: [id number] Date of Birth: 1985-12-03     Addend Date:  03/08/2016 OCCUPATIONAL THERAPY DISCHARGE SUMMARY  Visits from Start of Care: 2   Current functional level related to goals / functional outcomes: Unknown. Pt has not returned for OT visits since first treatment session on 02/22/16. Has not returned phone calls/voicemails. No goals met.      Plan: Patient agrees to discharge.  Patient goals were not met. Patient is being discharged due to not returning since the last visit.  ?????     Guadelupe Sabin, OTR/L  [phone] 03/08/2016

## 2016-02-28 ENCOUNTER — Telehealth (HOSPITAL_COMMUNITY): Payer: Self-pay | Admitting: Specialist

## 2016-02-28 ENCOUNTER — Ambulatory Visit (HOSPITAL_COMMUNITY): Payer: BLUE CROSS/BLUE SHIELD | Attending: Orthopaedic Surgery | Admitting: Specialist

## 2016-02-28 ENCOUNTER — Telehealth: Payer: Self-pay | Admitting: Orthopaedic Surgery

## 2016-02-28 NOTE — Telephone Encounter (Signed)
Patient did not attend his scheduled therapy visit this date.  Therapist called patient and left a message regarding no show and reminded him of his next visit, 03/01/16 at 1 pm.   Shirlean MylarBethany H. Rani Sisney, MHA, OTR/L [phone]

## 2016-02-29 MED ORDER — OXYCODONE-ACETAMINOPHEN 7.5-325 MG PO TABS: 1.0000 | ORAL_TABLET | Freq: Four times a day (QID) | ORAL | 0 refills | Status: DC | PRN

## 2016-03-01 ENCOUNTER — Ambulatory Visit (HOSPITAL_COMMUNITY): Payer: BLUE CROSS/BLUE SHIELD | Admitting: Occupational Therapy

## 2016-03-05 ENCOUNTER — Ambulatory Visit (INDEPENDENT_AMBULATORY_CARE_PROVIDER_SITE_OTHER): Payer: BLUE CROSS/BLUE SHIELD | Admitting: Orthopaedic Surgery

## 2016-03-05 ENCOUNTER — Encounter: Payer: Self-pay | Admitting: Orthopaedic Surgery

## 2016-03-05 VITALS — BP 134/83 | HR 104 | Temp 97.7°F | Ht 74.5 in | Wt 294.0 lb

## 2016-03-05 DIAGNOSIS — F17210 Nicotine dependence, cigarettes, uncomplicated: Secondary | ICD-10-CM | POA: Diagnosis not present

## 2016-03-05 DIAGNOSIS — M25512 Pain in left shoulder: Secondary | ICD-10-CM

## 2016-03-05 NOTE — Progress Notes (Signed)
Patient ZO:[id number] Shane Crawford, male DOB:May 30, 1985, 31 y.o. JXB:[id number]  Chief Complaint  Patient presents with  . Shoulder Pain    left    HPI  Shane Crawford is a 31 y.o. male who has chronic pain of the left shoulder. He has been going to OT and is better.  He still has further appointments.  He is doing his exercises at home. He has no new injury and no paresthesias.  He is smoking and cutting back.  He will continue cutting back. HPI  Body mass index is 37.24 kg/m.  ROS  Review of Systems  HENT: Positive for congestion.   Respiratory: Negative for cough and shortness of breath.   Cardiovascular: Negative for chest pain and leg swelling.  Endocrine: Positive for cold intolerance.  Musculoskeletal: Positive for arthralgias and neck pain.  Allergic/Immunologic: Negative for environmental allergies.    Past Medical History:  Diagnosis Date  . Allergy   . GERD (gastroesophageal reflux disease)   . Migraines   . Tobacco abuse     Past Surgical History:  Procedure Laterality Date  . APPENDECTOMY  09/2010  . CARPAL TUNNEL RELEASE Right 03/09/2013   Procedure: RIGHT CARPAL TUNNEL RELEASE OPEN;  Surgeon: Darreld Mclean, MD;  [facility];  Service: Orthopedics;  Laterality: Right;  . CARPAL TUNNEL RELEASE Left 11/30/2013   Procedure: CARPAL TUNNEL RELEASE;  Surgeon: Darreld Mclean, MD;  Location: [facility];  Service: Orthopedics;  Laterality: Left;  . CHOLECYSTECTOMY  11/07/2010   Procedure: LAPAROSCOPIC CHOLECYSTECTOMY;  Surgeon: Fabio Bering;  Location: [facility];  Service: General;  Laterality: N/A;  . COLONOSCOPY  10/23/2010   Procedure: COLONOSCOPY;  Surgeon: Dalia Heading;  [facility];  Service: Gastroenterology;  Laterality: N/A;  . LAPAROSCOPIC APPENDECTOMY  10/08/2010   Procedure: APPENDECTOMY LAPAROSCOPIC;  Surgeon: Fabio Bering;  Location: [facility];  Service: General;  Laterality: N/A;  . LAPAROSCOPY  10/08/2010   Procedure: LAPAROSCOPY  DIAGNOSTIC;  Surgeon: Fabio Bering;  Location: [facility];  Service: General;  Laterality: N/A;    Family History  Problem Relation Age of Onset  . Diabetes Maternal Grandmother   . Heart disease Maternal Grandfather   . Hyperlipidemia Maternal Grandfather   . Hypertension Maternal Grandfather   . Anesthesia problems Neg Hx   . Hypotension Neg Hx   . Malignant hyperthermia Neg Hx   . Pseudochol deficiency Neg Hx     Social History Social History  Substance Use Topics  . Smoking status: Current Every Day Smoker    Packs/day: 1.00    Years: 7.00    Types: Cigarettes  . Smokeless tobacco: Current User    Types: Snuff  . Alcohol use Yes     Comment: occassional- 1 beer q 3 weeks    No Known Allergies  Current Outpatient Prescriptions  Medication Sig Dispense Refill  . azithromycin (ZITHROMAX) 250 MG tablet tad 6 tablet 0  . chlorpheniramine-HYDROcodone (TUSSIONEX PENNKINETIC ER) 10-8 MG/5ML SUER Take 5 mLs by mouth 2 (two) times daily. 115 mL 0  . chlorpheniramine-HYDROcodone (TUSSIONEX PENNKINETIC ER) 10-8 MG/5ML SUER Take 5 mLs by mouth 2 (two) times daily. 115 mL 0  . fluticasone (FLONASE) 50 MCG/ACT nasal spray Place 2 sprays into both nostrils daily. 16 g 6  . naproxen (NAPROSYN) 500 MG tablet Take 1 tablet (500 mg total) by mouth 2 (two) times daily with a meal. 60 tablet 5  . oxyCODONE-acetaminophen (PERCOCET) 7.5-325 MG tablet Take 1 tablet by mouth every  6 (six) hours as needed for moderate pain or severe pain (Must last 30 days.Do not take and drive a car or operate machinery). 100 tablet 0  . traMADol (ULTRAM) 50 MG tablet Take 1 tablet (50 mg total) by mouth every 6 (six) hours as needed. 60 tablet 3   No current facility-administered medications for this visit.      Physical Exam  Blood pressure 134/83, pulse (!) 104, temperature 97.7 F (36.5 C), height 6' 2.5" (1.892 m), weight 294 lb (133.4 kg).  Constitutional: overall normal hygiene, normal nutrition,  well developed, normal grooming, normal body habitus. Assistive device:none  Musculoskeletal: gait and station Limp none, muscle tone and strength are normal, no tremors or atrophy is present.  .  Neurological: coordination overall normal.  Deep tendon reflex/nerve stretch intact.  Sensation normal.  Cranial nerves II-XII intact.   Skin:   Normal overall no scars, lesions, ulcers or rashes. No psoriasis.  Psychiatric: Alert and oriented x 3.  Recent memory intact, remote memory unclear.  Normal mood and affect. Well groomed.  Good eye contact.  Cardiovascular: overall no swelling, no varicosities, no edema bilaterally, normal temperatures of the legs and arms, no clubbing, cyanosis and good capillary refill.  Lymphatic: palpation is normal.  Examination of left Upper Extremity is done.  Inspection:   Overall:  Elbow non-tender without crepitus or defects, forearm non-tender without crepitus or defects, wrist non-tender without crepitus or defects, hand non-tender.    Shoulder: with glenohumeral joint tenderness, without effusion.   Upper arm: without swelling and tenderness   Range of motion:   Overall:  Full range of motion of the elbow, full range of motion of wrist and full range of motion in fingers.   Shoulder:  left  180 degrees forward flexion; 180 degrees abduction; 35 degrees internal rotation, 35 degrees external rotation, 20 degrees extension, 40 degrees adduction.   Stability:   Overall:  Shoulder, elbow and wrist stable   Strength and Tone:   Overall full shoulder muscles strength, full upper arm strength and normal upper arm bulk and tone.   The patient has been educated about the nature of the problem(s) and counseled on treatment options.  The patient appeared to understand what I have discussed and is in agreement with it.  Encounter Diagnoses  Name Primary?  . Pain in joint of left shoulder Yes  . Cigarette nicotine dependence without complication      PLAN Call if any problems.  Precautions discussed.  Continue current medications.   Return to clinic 3 weeks   Continue OT.  Electronically Signed Darreld McleanWayne Euel Castile, MD 1/9/20188:40 AM

## 2016-03-05 NOTE — Patient Instructions (Signed)
Steps to Quit Smoking Smoking tobacco can be harmful to your health and can affect almost every organ in your body. Smoking puts you, and those around you, at risk for developing many serious chronic diseases. Quitting smoking is difficult, but it is one of the best things that you can do for your health. It is never too late to quit. What are the benefits of quitting smoking? When you quit smoking, you lower your risk of developing serious diseases and conditions, such as:  Lung cancer or lung disease, such as COPD.  Heart disease.  Stroke.  Heart attack.  Infertility.  Osteoporosis and bone fractures.  Additionally, symptoms such as coughing, wheezing, and shortness of breath may get better when you quit. You may also find that you get sick less often because your body is stronger at fighting off colds and infections. If you are pregnant, quitting smoking can help to reduce your chances of having a baby of low birth weight. How do I get ready to quit? When you decide to quit smoking, create a plan to make sure that you are successful. Before you quit:  Pick a date to quit. Set a date within the next two weeks to give you time to prepare.  Write down the reasons why you are quitting. Keep this list in places where you will see it often, such as on your bathroom mirror or in your car or wallet.  Identify the people, places, things, and activities that make you want to smoke (triggers) and avoid them. Make sure to take these actions: ? Throw away all cigarettes at home, at work, and in your car. ? Throw away smoking accessories, such as ashtrays and lighters. ? Clean your car and make sure to empty the ashtray. ? Clean your home, including curtains and carpets.  Tell your family, friends, and coworkers that you are quitting. Support from your loved ones can make quitting easier.  Talk with your health care provider about your options for quitting smoking.  Find out what treatment  options are covered by your health insurance.  What strategies can I use to quit smoking? Talk with your healthcare provider about different strategies to quit smoking. Some strategies include:  Quitting smoking altogether instead of gradually lessening how much you smoke over a period of time. Research shows that quitting "cold turkey" is more successful than gradually quitting.  Attending in-person counseling to help you build problem-solving skills. You are more likely to have success in quitting if you attend several counseling sessions. Even short sessions of 10 minutes can be effective.  Finding resources and support systems that can help you to quit smoking and remain smoke-free after you quit. These resources are most helpful when you use them often. They can include: ? Online chats with a counselor. ? Telephone quitlines. ? Printed self-help materials. ? Support groups or group counseling. ? Text messaging programs. ? Mobile phone applications.  Taking medicines to help you quit smoking. (If you are pregnant or breastfeeding, talk with your health care provider first.) Some medicines contain nicotine and some do not. Both types of medicines help with cravings, but the medicines that include nicotine help to relieve withdrawal symptoms. Your health care provider may recommend: ? Nicotine patches, gum, or lozenges. ? Nicotine inhalers or sprays. ? Non-nicotine medicine that is taken by mouth.  Talk with your health care provider about combining strategies, such as taking medicines while you are also receiving in-person counseling. Using these two strategies together   makes you more likely to succeed in quitting than if you used either strategy on its own. If you are pregnant or breastfeeding, talk with your health care provider about finding counseling or other support strategies to quit smoking. Do not take medicine to help you quit smoking unless told to do so by your health care  provider. What things can I do to make it easier to quit? Quitting smoking might feel overwhelming at first, but there is a lot that you can do to make it easier. Take these important actions:  Reach out to your family and friends and ask that they support and encourage you during this time. Call telephone quitlines, reach out to support groups, or work with a counselor for support.  Ask people who smoke to avoid smoking around you.  Avoid places that trigger you to smoke, such as bars, parties, or smoke-break areas at work.  Spend time around people who do not smoke.  Lessen stress in your life, because stress can be a smoking trigger for some people. To lessen stress, try: ? Exercising regularly. ? Deep-breathing exercises. ? Yoga. ? Meditating. ? Performing a body scan. This involves closing your eyes, scanning your body from head to toe, and noticing which parts of your body are particularly tense. Purposefully relax the muscles in those areas.  Download or purchase mobile phone or tablet apps (applications) that can help you stick to your quit plan by providing reminders, tips, and encouragement. There are many free apps, such as QuitGuide from the CDC (Centers for Disease Control and Prevention). You can find other support for quitting smoking (smoking cessation) through smokefree.gov and other websites.  How will I feel when I quit smoking? Within the first 24 hours of quitting smoking, you may start to feel some withdrawal symptoms. These symptoms are usually most noticeable 2-3 days after quitting, but they usually do not last beyond 2-3 weeks. Changes or symptoms that you might experience include:  Mood swings.  Restlessness, anxiety, or irritation.  Difficulty concentrating.  Dizziness.  Strong cravings for sugary foods in addition to nicotine.  Mild weight gain.  Constipation.  Nausea.  Coughing or a sore throat.  Changes in how your medicines work in your  body.  A depressed mood.  Difficulty sleeping (insomnia).  After the first 2-3 weeks of quitting, you may start to notice more positive results, such as:  Improved sense of smell and taste.  Decreased coughing and sore throat.  Slower heart rate.  Lower blood pressure.  Clearer skin.  The ability to breathe more easily.  Fewer sick days.  Quitting smoking is very challenging for most people. Do not get discouraged if you are not successful the first time. Some people need to make many attempts to quit before they achieve long-term success. Do your best to stick to your quit plan, and talk with your health care provider if you have any questions or concerns. This information is not intended to replace advice given to you by your health care provider. Make sure you discuss any questions you have with your health care provider. Document Released: 02/05/2001 Document Revised: 10/10/2015 Document Reviewed: 06/28/2014 Elsevier Interactive Patient Education  2017 Elsevier Inc.  

## 2016-03-06 ENCOUNTER — Telehealth (HOSPITAL_COMMUNITY): Payer: Self-pay

## 2016-03-06 ENCOUNTER — Ambulatory Visit (HOSPITAL_COMMUNITY): Payer: BLUE CROSS/BLUE SHIELD

## 2016-03-06 NOTE — Telephone Encounter (Signed)
Called patient regarding no show. Left message informing patient that this was his 2 no show and reminded him of his next appointment. It was requested that he call if he is unable to make his next appointment.   Limmie PatriciaLaura Taneal Sonntag, OTR/L,CBIS  [phone]

## 2016-03-08 ENCOUNTER — Ambulatory Visit (HOSPITAL_COMMUNITY): Payer: BLUE CROSS/BLUE SHIELD | Admitting: Occupational Therapy

## 2016-03-13 ENCOUNTER — Encounter (HOSPITAL_COMMUNITY): Payer: Self-pay

## 2016-03-15 ENCOUNTER — Encounter (HOSPITAL_COMMUNITY): Payer: Self-pay | Admitting: Occupational Therapy

## 2016-03-26 ENCOUNTER — Encounter: Payer: Self-pay | Admitting: Orthopaedic Surgery

## 2016-03-26 ENCOUNTER — Ambulatory Visit: Payer: Self-pay | Admitting: Orthopaedic Surgery

## 2016-04-01 ENCOUNTER — Telehealth: Payer: Self-pay | Admitting: Orthopaedic Surgery

## 2016-04-01 ENCOUNTER — Other Ambulatory Visit: Payer: Self-pay | Admitting: Orthopaedic Surgery

## 2016-04-02 MED ORDER — OXYCODONE-ACETAMINOPHEN 7.5-325 MG PO TABS: 1.0000 | ORAL_TABLET | Freq: Four times a day (QID) | ORAL | 0 refills | Status: DC | PRN

## 2016-04-11 ENCOUNTER — Ambulatory Visit: Payer: Self-pay | Admitting: Orthopaedic Surgery

## 2016-05-05 ENCOUNTER — Telehealth: Payer: Self-pay | Admitting: Orthopaedic Surgery

## 2016-05-06 MED ORDER — OXYCODONE-ACETAMINOPHEN 7.5-325 MG PO TABS: 1.0000 | ORAL_TABLET | Freq: Four times a day (QID) | ORAL | 0 refills | Status: DC | PRN

## 2016-05-09 ENCOUNTER — Ambulatory Visit: Payer: Self-pay | Admitting: Orthopaedic Surgery

## 2016-05-10 ENCOUNTER — Encounter: Payer: Self-pay | Admitting: Orthopaedic Surgery

## 2016-06-04 ENCOUNTER — Telehealth: Payer: Self-pay | Admitting: Orthopaedic Surgery

## 2016-06-04 MED ORDER — OXYCODONE-ACETAMINOPHEN 7.5-325 MG PO TABS: 1.0000 | ORAL_TABLET | Freq: Four times a day (QID) | ORAL | 0 refills | Status: DC | PRN

## 2016-06-05 ENCOUNTER — Ambulatory Visit: Payer: Self-pay | Admitting: Orthopaedic Surgery

## 2016-06-11 ENCOUNTER — Ambulatory Visit (INDEPENDENT_AMBULATORY_CARE_PROVIDER_SITE_OTHER): Payer: BLUE CROSS/BLUE SHIELD | Admitting: Orthopaedic Surgery

## 2016-06-11 ENCOUNTER — Encounter: Payer: Self-pay | Admitting: Orthopaedic Surgery

## 2016-06-11 VITALS — BP 133/86 | HR 112 | Temp 98.6°F | Ht 74.5 in | Wt 303.0 lb

## 2016-06-11 DIAGNOSIS — M25512 Pain in left shoulder: Secondary | ICD-10-CM | POA: Diagnosis not present

## 2016-06-11 DIAGNOSIS — F17210 Nicotine dependence, cigarettes, uncomplicated: Secondary | ICD-10-CM

## 2016-06-11 NOTE — Patient Instructions (Signed)
Steps to Quit Smoking Smoking tobacco can be bad for your health. It can also affect almost every organ in your body. Smoking puts you and people around you at risk for many serious long-lasting (chronic) diseases. Quitting smoking is hard, but it is one of the best things that you can do for your health. It is never too late to quit. What are the benefits of quitting smoking? When you quit smoking, you lower your risk for getting serious diseases and conditions. They can include:  Lung cancer or lung disease.  Heart disease.  Stroke.  Heart attack.  Not being able to have children (infertility).  Weak bones (osteoporosis) and broken bones (fractures). If you have coughing, wheezing, and shortness of breath, those symptoms may get better when you quit. You may also get sick less often. If you are pregnant, quitting smoking can help to lower your chances of having a baby of low birth weight. What can I do to help me quit smoking? Talk with your doctor about what can help you quit smoking. Some things you can do (strategies) include:  Quitting smoking totally, instead of slowly cutting back how much you smoke over a period of time.  Going to in-person counseling. You are more likely to quit if you go to many counseling sessions.  Using resources and support systems, such as:  Online chats with a counselor.  Phone quitlines.  Printed self-help materials.  Support groups or group counseling.  Text messaging programs.  Mobile phone apps or applications.  Taking medicines. Some of these medicines may have nicotine in them. If you are pregnant or breastfeeding, do not take any medicines to quit smoking unless your doctor says it is okay. Talk with your doctor about counseling or other things that can help you. Talk with your doctor about using more than one strategy at the same time, such as taking medicines while you are also going to in-person counseling. This can help make quitting  easier. What things can I do to make it easier to quit? Quitting smoking might feel very hard at first, but there is a lot that you can do to make it easier. Take these steps:  Talk to your family and friends. Ask them to support and encourage you.  Call phone quitlines, reach out to support groups, or work with a counselor.  Ask people who smoke to not smoke around you.  Avoid places that make you want (trigger) to smoke, such as:  Bars.  Parties.  Smoke-break areas at work.  Spend time with people who do not smoke.  Lower the stress in your life. Stress can make you want to smoke. Try these things to help your stress:  Getting regular exercise.  Deep-breathing exercises.  Yoga.  Meditating.  Doing a body scan. To do this, close your eyes, focus on one area of your body at a time from head to toe, and notice which parts of your body are tense. Try to relax the muscles in those areas.  Download or buy apps on your mobile phone or tablet that can help you stick to your quit plan. There are many free apps, such as QuitGuide from the CDC (Centers for Disease Control and Prevention). You can find more support from smokefree.gov and other websites. This information is not intended to replace advice given to you by your health care provider. Make sure you discuss any questions you have with your health care provider. Document Released: 12/08/2008 Document Revised: 10/10/2015 Document   Reviewed: 06/28/2014 Elsevier Interactive Patient Education  2017 Elsevier Inc.  

## 2016-06-11 NOTE — Progress Notes (Signed)
Patient WU:[id number] Shane Crawford, male DOB:[birth year], 31 y.o. FAO:[id number]  Chief Complaint  Patient presents with  . Follow-up    left shoulder pain    HPI  Shane Crawford is a 31 y.o. male who has chronic pain of the left shoulder. It hurts more with cold weather and rainy days.  He has no new trauma.  He has no paresthesias.  He is active and doing his exercises.  He continues to smoke and is willing to cut back. HPI  Body mass index is 38.38 kg/m.  ROS  Review of Systems  HENT: Positive for congestion.   Respiratory: Negative for cough and shortness of breath.   Cardiovascular: Negative for chest pain and leg swelling.  Endocrine: Positive for cold intolerance.  Musculoskeletal: Positive for arthralgias and neck pain.  Allergic/Immunologic: Negative for environmental allergies.    Past Medical History:  Diagnosis Date  . Allergy   . GERD (gastroesophageal reflux disease)   . Migraines   . Tobacco abuse     Past Surgical History:  Procedure Laterality Date  . APPENDECTOMY  09/2010  . CARPAL TUNNEL RELEASE Right 03/09/2013   Procedure: RIGHT CARPAL TUNNEL RELEASE OPEN;  Surgeon: Darreld Mclean, MD;  Location: [facility];  Service: Orthopedics;  Laterality: Right;  . CARPAL TUNNEL RELEASE Left 11/30/2013   Procedure: CARPAL TUNNEL RELEASE;  Surgeon: Darreld Mclean, MD;  Location: [facility];  Service: Orthopedics;  Laterality: Left;  . CHOLECYSTECTOMY  11/07/2010   Procedure: LAPAROSCOPIC CHOLECYSTECTOMY;  Surgeon: Fabio Bering;  Location: [facility];  Service: General;  Laterality: N/A;  . COLONOSCOPY  10/23/2010   Procedure: COLONOSCOPY;  Surgeon: Dalia Heading;  Location: [facility];  Service: Gastroenterology;  Laterality: N/A;  . LAPAROSCOPIC APPENDECTOMY  10/08/2010   Procedure: APPENDECTOMY LAPAROSCOPIC;  Surgeon: Fabio Bering;  Location: [facility];  Service: General;  Laterality: N/A;  . LAPAROSCOPY  10/08/2010   Procedure: LAPAROSCOPY DIAGNOSTIC;  Surgeon: Fabio Bering;  Location: [facility];  Service: General;  Laterality: N/A;    Family History  Problem Relation Age of Onset  . Diabetes Maternal Grandmother   . Heart disease Maternal Grandfather   . Hyperlipidemia Maternal Grandfather   . Hypertension Maternal Grandfather   . Anesthesia problems Neg Hx   . Hypotension Neg Hx   . Malignant hyperthermia Neg Hx   . Pseudochol deficiency Neg Hx     Social History Social History  Substance Use Topics  . Smoking status: Current Every Day Smoker    Packs/day: 1.00    Years: 7.00    Types: Cigarettes  . Smokeless tobacco: Current User    Types: Snuff  . Alcohol use Yes     Comment: occassional- 1 beer q 3 weeks    No Known Allergies  Current Outpatient Prescriptions  Medication Sig Dispense Refill  . azithromycin (ZITHROMAX) 250 MG tablet tad 6 tablet 0  . chlorpheniramine-HYDROcodone (TUSSIONEX PENNKINETIC ER) 10-8 MG/5ML SUER Take 5 mLs by mouth 2 (two) times daily. 115 mL 0  . chlorpheniramine-HYDROcodone (TUSSIONEX PENNKINETIC ER) 10-8 MG/5ML SUER Take 5 mLs by mouth 2 (two) times daily. 115 mL 0  . fluticasone (FLONASE) 50 MCG/ACT nasal spray Place 2 sprays into both nostrils daily. 16 g 6  . naproxen (NAPROSYN) 500 MG tablet Take 1 tablet (500 mg total) by mouth 2 (two) times daily with a meal. 60 tablet 5  . oxyCODONE-acetaminophen (PERCOCET) 7.5-325 MG tablet Take 1 tablet by mouth every 6 (six) hours as  needed for moderate pain or severe pain (Must last 30 days.Do not take and drive a car or operate machinery). 90 tablet 0  . traMADol (ULTRAM) 50 MG tablet Take 1 tablet (50 mg total) by mouth every 6 (six) hours as needed. 60 tablet 3   No current facility-administered medications for this visit.      Physical Exam  Blood pressure 133/86, pulse (!) 112, temperature 98.6 F (37 C), height 6' 2.5" (1.892 m), weight (!) 303 lb (137.4 kg).  Constitutional: overall normal hygiene, normal nutrition, well developed, normal  grooming, normal body habitus. Assistive device:none  Musculoskeletal: gait and station Limp none, muscle tone and strength are normal, no tremors or atrophy is present.  .  Neurological: coordination overall normal.  Deep tendon reflex/nerve stretch intact.  Sensation normal.  Cranial nerves II-XII intact.   Skin:   Normal overall no scars, lesions, ulcers or rashes. No psoriasis.  Psychiatric: Alert and oriented x 3.  Recent memory intact, remote memory unclear.  Normal mood and affect. Well groomed.  Good eye contact.  Cardiovascular: overall no swelling, no varicosities, no edema bilaterally, normal temperatures of the legs and arms, no clubbing, cyanosis and good capillary refill.  Lymphatic: palpation is normal.  Examination of left Upper Extremity is done.  Inspection:   Overall:  Elbow non-tender without crepitus or defects, forearm non-tender without crepitus or defects, wrist non-tender without crepitus or defects, hand non-tender.    Shoulder: with glenohumeral joint tenderness, without effusion.   Upper arm: without swelling and tenderness   Range of motion:   Overall:  Full range of motion of the elbow, full range of motion of wrist and full range of motion in fingers.   Shoulder:  left  165 degrees forward flexion; 150 degrees abduction; 35 degrees internal rotation, 35 degrees external rotation, 20 degrees extension, 40 degrees adduction.   Stability:   Overall:  Shoulder, elbow and wrist stable   Strength and Tone:   Overall full shoulder muscles strength, full upper arm strength and normal upper arm bulk and tone.   The patient has been educated about the nature of the problem(s) and counseled on treatment options.  The patient appeared to understand what I have discussed and is in agreement with it.  Encounter Diagnoses  Name Primary?  . Pain in joint of left shoulder Yes  . Cigarette nicotine dependence without complication     PLAN Call if any problems.   Precautions discussed.  Continue current medications.   Return to clinic 3 months   Electronically Signed Darreld Mclean, MD 4/17/20184:27 PM

## 2016-07-03 ENCOUNTER — Telehealth: Payer: Self-pay | Admitting: Orthopaedic Surgery

## 2016-07-04 MED ORDER — OXYCODONE-ACETAMINOPHEN 7.5-325 MG PO TABS: 1.0000 | ORAL_TABLET | Freq: Four times a day (QID) | ORAL | 0 refills | Status: DC | PRN

## 2016-08-01 ENCOUNTER — Telehealth: Payer: Self-pay | Admitting: Orthopaedic Surgery

## 2016-08-01 MED ORDER — OXYCODONE-ACETAMINOPHEN 7.5-325 MG PO TABS: 1.0000 | ORAL_TABLET | Freq: Four times a day (QID) | ORAL | 0 refills | Status: DC | PRN

## 2016-08-12 ENCOUNTER — Telehealth: Payer: Self-pay | Admitting: *Deleted

## 2016-08-12 ENCOUNTER — Encounter (HOSPITAL_COMMUNITY): Payer: Self-pay | Admitting: Emergency Medicine

## 2016-08-12 ENCOUNTER — Emergency Department (HOSPITAL_COMMUNITY)
Admission: EM | Admit: 2016-08-12 | Discharge: 2016-08-12 | Disposition: A | Discharge status: Home / Self Care | Payer: BLUE CROSS/BLUE SHIELD | Attending: Emergency Medicine | Admitting: Emergency Medicine

## 2016-08-12 DIAGNOSIS — L03114 Cellulitis of left upper limb: Secondary | ICD-10-CM | POA: Diagnosis not present

## 2016-08-12 DIAGNOSIS — L299 Pruritus, unspecified: Secondary | ICD-10-CM | POA: Diagnosis present

## 2016-08-12 DIAGNOSIS — S40862A Insect bite (nonvenomous) of left upper arm, initial encounter: Secondary | ICD-10-CM | POA: Insufficient documentation

## 2016-08-12 DIAGNOSIS — Y929 Unspecified place or not applicable: Secondary | ICD-10-CM | POA: Insufficient documentation

## 2016-08-12 DIAGNOSIS — F17210 Nicotine dependence, cigarettes, uncomplicated: Secondary | ICD-10-CM | POA: Insufficient documentation

## 2016-08-12 DIAGNOSIS — W57XXXA Bitten or stung by nonvenomous insect and other nonvenomous arthropods, initial encounter: Secondary | ICD-10-CM | POA: Insufficient documentation

## 2016-08-12 DIAGNOSIS — Y999 Unspecified external cause status: Secondary | ICD-10-CM | POA: Insufficient documentation

## 2016-08-12 DIAGNOSIS — Y939 Activity, unspecified: Secondary | ICD-10-CM | POA: Diagnosis not present

## 2016-08-12 DIAGNOSIS — Z79899 Other long term (current) drug therapy: Secondary | ICD-10-CM | POA: Diagnosis not present

## 2016-08-12 MED ORDER — CEPHALEXIN 500 MG PO CAPS: 500.0000 mg | ORAL_CAPSULE | Freq: Four times a day (QID) | ORAL | 0 refills | Status: AC

## 2016-08-12 MED ORDER — PREDNISONE 10 MG PO TABS: 20.0000 mg | ORAL_TABLET | Freq: Every day | ORAL | 0 refills | Status: AC

## 2016-08-12 MED ORDER — PREDNISONE 50 MG PO TABS
60.0000 mg | ORAL_TABLET | Freq: Once | ORAL | Status: AC
  Administered 2016-08-12: 60 mg via ORAL
  Filled 2016-08-12: qty 1

## 2016-08-12 MED ORDER — CEPHALEXIN 500 MG PO CAPS
500.0000 mg | ORAL_CAPSULE | Freq: Once | ORAL | Status: AC
  Administered 2016-08-12: 500 mg via ORAL
  Filled 2016-08-12: qty 1

## 2016-08-12 NOTE — Discharge Instructions (Signed)
Prescription for antibiotic and prednisone. Can apply ice pack to the area. Also recommend Benadryl.

## 2016-08-12 NOTE — Telephone Encounter (Signed)
Patient called back requesting for Dr Lindaann SloughNelson's nurse to call him back before going to a ER.

## 2016-08-12 NOTE — Telephone Encounter (Signed)
Called Shane Crawford, made him an appt for tomorrow with dr Delton Seenelson. Advised to go to er if his arm gets worse.

## 2016-08-12 NOTE — Telephone Encounter (Signed)
Patient left a message stating he thought he got bit by a Mosquito on his arm, but his arm is now red and swollen and it is burning, I tried to call the patient back no answer left a message and made him aware that Dr Delton SeeNelson does not have a available appointment today but I will get a message back to Dr Lindaann SloughNelson's nurse and recommended him to go to a urgent care or ER. [phone]

## 2016-08-12 NOTE — ED Triage Notes (Addendum)
Pt felt Itching to left upper lateral arm last night. Woke up with swelling and redness this am. Left arm red with swelling mostly to upper arm and tight and warm to touch Small red spot noted to lateral left upper arm. rashy area noted to left hand that pt states has been there x 2 months

## 2016-08-13 ENCOUNTER — Ambulatory Visit: Payer: Self-pay | Admitting: Family Medicine

## 2016-08-13 NOTE — Telephone Encounter (Signed)
Patient called left message to cancel this appointment for today, patient states he went to the ER yesterday and his arm is a little better.

## 2016-08-14 NOTE — ED Provider Notes (Signed)
[facility] Provider Note   CSN: [id number] Arrival date & time: 08/12/16  1237     History   Chief Complaint Chief Complaint  Patient presents with  . Insect Bite    HPI Shane Crawford is a 31 y.o. male.  Redness and pruritus to left upper arm since last night after a possible bite from an insect. No fever, sweats, chills. Area is tender to touch. Severity of symptoms is mild.      Past Medical History:  Diagnosis Date  . Allergy   . GERD (gastroesophageal reflux disease)   . Migraines   . Tobacco abuse     Patient Active Problem List   Diagnosis Date Noted  . Tobacco abuse 11/14/2015  . Obesity 11/14/2015  . Pain in joint of left shoulder 04/04/2015    Past Surgical History:  Procedure Laterality Date  . APPENDECTOMY  09/2010  . CARPAL TUNNEL RELEASE Right 03/09/2013   Procedure: RIGHT CARPAL TUNNEL RELEASE OPEN;  Surgeon: Darreld McleanWayne Keeling, MD;  Location: [facility];  Service: Orthopedics;  Laterality: Right;  . CARPAL TUNNEL RELEASE Left 11/30/2013   Procedure: CARPAL TUNNEL RELEASE;  Surgeon: Darreld McleanWayne Keeling, MD;  Location: [facility];  Service: Orthopedics;  Laterality: Left;  . CHOLECYSTECTOMY  11/07/2010   Procedure: LAPAROSCOPIC CHOLECYSTECTOMY;  Surgeon: Fabio BeringBrent C Ziegler;  Location: [facility];  Service: General;  Laterality: N/A;  . COLONOSCOPY  10/23/2010   Procedure: COLONOSCOPY;  Surgeon: Dalia HeadingMark A Jenkins;  [facility]: [facility];  Service: Gastroenterology;  Laterality: N/A;  . LAPAROSCOPIC APPENDECTOMY  10/08/2010   Procedure: APPENDECTOMY LAPAROSCOPIC;  Surgeon: Fabio BeringBrent C Ziegler;  Location: [facility];  Service: General;  Laterality: N/A;  . LAPAROSCOPY  10/08/2010   Procedure: LAPAROSCOPY DIAGNOSTIC;  Surgeon: Fabio BeringBrent C Ziegler;  Location: [facility];  Service: General;  Laterality: N/A;       Home Medications    Prior to Admission medications   Medication Sig Start Date End Date Taking? Authorizing Provider  diphenhydrAMINE (BENADRYL) 25 MG tablet Take 25 mg  by mouth every 6 (six) hours as needed.   Yes [provider]  oxyCODONE-acetaminophen (PERCOCET) 7.5-325 MG tablet Take 1 tablet by mouth every 6 (six) hours as needed for moderate pain or severe pain (Must last 30 days.Do not take and drive a car or operate machinery). 08/01/16  Yes Darreld McleanKeeling, Wayne, MD  cephALEXin (KEFLEX) 500 MG capsule Take 1 capsule (500 mg total) by mouth 4 (four) times daily. 08/12/16   Donnetta Hutchingook, Corinda Ammon, MD  predniSONE (DELTASONE) 10 MG tablet Take 2 tablets (20 mg total) by mouth daily. 08/12/16   Donnetta Hutchingook, Karmen Altamirano, MD    Family History Family History  Problem Relation Age of Onset  . Diabetes Maternal Grandmother   . Heart disease Maternal Grandfather   . Hyperlipidemia Maternal Grandfather   . Hypertension Maternal Grandfather   . Anesthesia problems Neg Hx   . Hypotension Neg Hx   . Malignant hyperthermia Neg Hx   . Pseudochol deficiency Neg Hx     Social History Social History  Substance Use Topics  . Smoking status: Current Every Day Smoker    Packs/day: 1.00    Years: 7.00    Types: Cigarettes  . Smokeless tobacco: Current User    Types: Snuff  . Alcohol use Yes     Comment: occassional- 1 beer q 3 weeks     Allergies   Patient has no known allergies.   Review of Systems Review of Systems  All other systems  reviewed and are negative.    Physical Exam Updated Vital Signs BP 121/80   Pulse (!) 115   Temp 98.3 F (36.8 C) (Oral)   Resp 18   Ht 6' (1.829 m)   Wt 127 kg (280 lb)   SpO2 99%   BMI 37.97 kg/m   Physical Exam  Constitutional: He is oriented to person, place, and time. He appears well-developed and well-nourished.  HENT:  Head: Normocephalic and atraumatic.  Eyes: Conjunctivae are normal.  Neck: Neck supple.  Musculoskeletal: Normal range of motion.  Neurological: He is alert and oriented to person, place, and time.  Skin:  Left upper arm: Erythema approximately 12 cm in diameter on the posterior proximal humerus.  No  induration  Psychiatric: He has a normal mood and affect. His behavior is normal.  Nursing note and vitals reviewed.    ED Treatments / Results  Labs (all labs ordered are listed, but only abnormal results are displayed) Labs Reviewed - No data to display  EKG  EKG Interpretation None       Radiology No results found.  Procedures Procedures (including critical care time)  Medications Ordered in ED Medications  cephALEXin (KEFLEX) capsule 500 mg (500 mg Oral Given 08/12/16 1339)  predniSONE (DELTASONE) tablet 60 mg (60 mg Oral Given 08/12/16 1339)     Initial Impression / Assessment and Plan / ED Course  I have reviewed the triage vital signs and the nursing notes.  Pertinent labs & imaging results that were available during my care of the patient were reviewed by me and considered in my medical decision making (see chart for details).     History of physical consistent with inflammation/cellulitis secondary to insect bite. Rx Keflex 500 mg and prednisone  Final Clinical Impressions(s) / ED Diagnoses   Final diagnoses:  Cellulitis of left upper extremity    New Prescriptions Discharge Medication List as of 08/12/2016  2:22 PM    START taking these medications   Details  cephALEXin (KEFLEX) 500 MG capsule Take 1 capsule (500 mg total) by mouth 4 (four) times daily., Starting Mon 08/12/2016, Print    predniSONE (DELTASONE) 10 MG tablet Take 2 tablets (20 mg total) by mouth daily., Starting Mon 08/12/2016, Print         Donnetta Hutching, MD 08/14/16 [phone]

## 2016-08-29 ENCOUNTER — Other Ambulatory Visit: Payer: Self-pay | Admitting: Orthopaedic Surgery

## 2016-08-30 ENCOUNTER — Other Ambulatory Visit: Payer: Self-pay | Admitting: *Deleted

## 2016-08-30 MED ORDER — OXYCODONE-ACETAMINOPHEN 7.5-325 MG PO TABS: 1.0000 | ORAL_TABLET | Freq: Four times a day (QID) | ORAL | 0 refills | Status: AC | PRN

## 2016-09-10 ENCOUNTER — Ambulatory Visit: Payer: Self-pay | Admitting: Orthopaedic Surgery

## 2016-09-17 ENCOUNTER — Ambulatory Visit: Payer: Self-pay | Admitting: Orthopaedic Surgery

## 2016-09-18 ENCOUNTER — Ambulatory Visit: Payer: Self-pay | Admitting: Orthopaedic Surgery

## 2016-09-25 DEATH — deceased

## 2017-11-26 ENCOUNTER — Encounter (HOSPITAL_COMMUNITY): Payer: Self-pay | Admitting: Occupational Therapy
